# Patient Record
Sex: Female | Born: 1970 | Race: White | Marital: Married | Smoking: Never smoker
Health system: Southern US, Community
[De-identification: ages and names within clinical notes are randomized; demographics above are authoritative.]

## PROBLEM LIST (undated history)

## (undated) DIAGNOSIS — F319 Bipolar disorder, unspecified: Secondary | ICD-10-CM

## (undated) DIAGNOSIS — D689 Coagulation defect, unspecified: Secondary | ICD-10-CM

## (undated) DIAGNOSIS — F988 Other specified behavioral and emotional disorders with onset usually occurring in childhood and adolescence: Secondary | ICD-10-CM

## (undated) HISTORY — DX: Bipolar disorder, unspecified: F31.9

## (undated) HISTORY — DX: Coagulation defect, unspecified: D68.9

## (undated) HISTORY — PX: APPENDECTOMY: SHX54

## (undated) HISTORY — DX: Other specified behavioral and emotional disorders with onset usually occurring in childhood and adolescence: F98.8

## (undated) HISTORY — PX: CHOLECYSTECTOMY: SHX55

---

## 2000-05-19 ENCOUNTER — Emergency Department (HOSPITAL_COMMUNITY): Admission: EM | Admit: 2000-05-19 | Discharge: 2000-05-19 | Payer: Self-pay | Admitting: Emergency Medicine

## 2000-07-11 ENCOUNTER — Other Ambulatory Visit: Admission: RE | Admit: 2000-07-11 | Discharge: 2000-07-11 | Payer: Self-pay | Admitting: Internal Medicine

## 2000-07-22 ENCOUNTER — Encounter: Payer: Self-pay | Admitting: Gastroenterology

## 2000-07-22 ENCOUNTER — Ambulatory Visit (HOSPITAL_COMMUNITY): Admission: RE | Admit: 2000-07-22 | Discharge: 2000-07-22 | Payer: Self-pay | Admitting: Gastroenterology

## 2000-07-26 ENCOUNTER — Emergency Department (HOSPITAL_COMMUNITY): Admission: EM | Admit: 2000-07-26 | Discharge: 2000-07-26 | Payer: Self-pay | Admitting: Emergency Medicine

## 2000-08-03 ENCOUNTER — Encounter: Admission: RE | Admit: 2000-08-03 | Discharge: 2000-08-03 | Payer: Self-pay | Admitting: Gastroenterology

## 2000-08-03 ENCOUNTER — Encounter: Payer: Self-pay | Admitting: Gastroenterology

## 2015-03-26 ENCOUNTER — Telehealth: Payer: Self-pay | Admitting: Hematology and Oncology

## 2015-03-26 ENCOUNTER — Encounter: Payer: Self-pay | Admitting: Hematology and Oncology

## 2015-03-26 NOTE — Telephone Encounter (Signed)
Schedule appt from ref per MD preferred request. Faxed Pt Ref Letter to Provider and sent New Pt Packet to Patient. Set Reminder 5 Days prior to appt

## 2015-03-26 NOTE — Telephone Encounter (Signed)
Pt requested a change in schedule due to work conflicts

## 2015-04-14 ENCOUNTER — Ambulatory Visit: Payer: Self-pay | Admitting: Hematology and Oncology

## 2015-04-15 ENCOUNTER — Encounter: Payer: Self-pay | Admitting: Hematology and Oncology

## 2015-04-15 ENCOUNTER — Ambulatory Visit (HOSPITAL_BASED_OUTPATIENT_CLINIC_OR_DEPARTMENT_OTHER): Payer: 59 | Admitting: Hematology and Oncology

## 2015-04-15 VITALS — BP 132/78 | HR 83 | Temp 98.2°F | Resp 19 | Ht 63.0 in | Wt 264.6 lb

## 2015-04-15 DIAGNOSIS — Z7982 Long term (current) use of aspirin: Secondary | ICD-10-CM | POA: Diagnosis not present

## 2015-04-15 DIAGNOSIS — Z86718 Personal history of other venous thrombosis and embolism: Secondary | ICD-10-CM

## 2015-04-15 DIAGNOSIS — D6851 Activated protein C resistance: Secondary | ICD-10-CM | POA: Diagnosis not present

## 2015-04-15 HISTORY — DX: Personal history of other venous thrombosis and embolism: Z86.718

## 2015-04-15 NOTE — Progress Notes (Signed)
[facility] CONSULT NOTE  Patient Care Team: London Pepper, MD as PCP - General (Family Medicine)  CHIEF COMPLAINTS/PURPOSE OF CONSULTATION:  Remote history of DVT, for further management  HISTORY OF PRESENTING ILLNESS:  Dana Rodgers 45 y.o. female is here because of remote history of DVT. Unfortunately, I do not have any outside records. The patient is here today accompanied by her sister. She was able to give adequate history. The patient has relocated from [place] to be closer to family members. According to the patient, she had severe left upper arm pain approximately 4 years ago and presented to the emergency room and was evaluated. She was subsequently found to have blood clot in the left upper extremity. Ultrasound venous Doppler also showed concurrent evidence of blood clot in the left lower extremity although she was not symptomatic. According to the patient, a thrombophilia panel revealed that she has factor V Leiden mutation. She was hospitalized for 5-6 days and was transitioned to warfarin. The physician has difficulties getting her INR level under controlled. She had one episode of severe gum bleeding and was subsequently switched to fondaparinux. In addition to fondaparinux she was placed on chronic supplemental folic acid and vitamin B 12. She ran out of her prescription a month ago and has been off anticoagulation therapy. Since the diagnosis of blood clot, she has chronic left shoulder pain, left arm pain and left leg pain. Her physician has ordered frequent imaging study every 6 months while she is on anticoagulation therapy along with blood work. Prior to the diagnosis of blood clots, she denies any history of trauma,  long distance travel, dehydration, recent surgery, smoking or prolonged immobilization. The patient had placement of Mirena IUD at that time. She denies recent chest pain on exertion, shortness of breath on minimal exertion, pre-syncopal  episodes, hemoptysis, or palpitation. She had no prior history or diagnosis of cancer. Her age appropriate screening programs are up-to-date. She had prior surgeries before and never had perioperative thromboembolic events. The patient had been exposed to birth control pills and never had thrombotic events. The patient had been pregnant before and denies history of peripartum thromboembolic event or history of recurrent miscarriages. She has one stillborn. There is family history of blood clots in her sister but it was part of a postoperative complication. One grandfather also had history of blood clot. The patient denies any recent signs or symptoms of bleeding such as spontaneous epistaxis, hematuria or hematochezia. She complained of easy bruising.  MEDICAL HISTORY:  Past Medical History  Diagnosis Date  . Clotting disorder ([facility])   . Bipolar disorder ([facility])   . ADD (attention deficit disorder)     SURGICAL HISTORY: Past Surgical History  Procedure Laterality Date  . Cholecystectomy    . Appendectomy      SOCIAL HISTORY: Social History   Social History  . Marital Status: Married    Spouse Name: N/A  . Number of Children: N/A  . Years of Education: N/A   Occupational History  . Not on file.   Social History Main Topics  . Smoking status: Never Smoker   . Smokeless tobacco: Never Used  . Alcohol Use: No  . Drug Use: No  . Sexual Activity: Not on file     Comment: married, not working   Other Topics Concern  . Not on file   Social History Narrative  . No narrative on file    FAMILY HISTORY: Family History  Problem Relation Age of  Onset  . Clotting disorder Sister     DVT post op  . Cancer Paternal Grandmother     breast ca  . Clotting disorder Paternal Grandfather     DVT    ALLERGIES:  is allergic to latex and phenobarbital.  MEDICATIONS:  Current Outpatient Prescriptions  Medication Sig Dispense Refill  . ALPRAZolam (XANAX) 1 MG tablet     .  amphetamine-dextroamphetamine (ADDERALL) 20 MG tablet     . Cyanocobalamin 1000 MCG/ML KIT Inject 1,000 mcg as directed once a week.    . lamoTRIgine (LAMICTAL) 200 MG tablet     . levothyroxine (SYNTHROID, LEVOTHROID) 88 MCG tablet     . LORazepam (ATIVAN) 1 MG tablet     . SAPHRIS 5 MG SUBL 24 hr tablet     . VENTOLIN HFA 108 (90 Base) MCG/ACT inhaler     . zolpidem (AMBIEN) 10 MG tablet     . folic acid (FOLVITE) 578 MCG tablet Take 400 mcg by mouth daily.     No current facility-administered medications for this visit.    REVIEW OF SYSTEMS:   Constitutional: Denies fevers, chills or abnormal night sweats Eyes: Denies blurriness of vision, double vision or watery eyes Ears, nose, mouth, throat, and face: Denies mucositis or sore throat Respiratory: Denies cough, dyspnea or wheezes Cardiovascular: Denies palpitation, chest discomfort or lower extremity swelling Gastrointestinal:  Denies nausea, heartburn or change in bowel habits Skin: Denies abnormal skin rashes Lymphatics: Denies new lymphadenopathy  Neurological:Denies numbness, tingling or new weaknesses Behavioral/Psych: Mood is stable, no new changes  All other systems were reviewed with the patient and are negative.  PHYSICAL EXAMINATION: ECOG PERFORMANCE STATUS: 0 - Asymptomatic  Filed Vitals:   04/15/15 1359  BP: 132/78  Pulse: 83  Temp: 98.2 F (36.8 C)  Resp: 19   Filed Weights   04/15/15 1359  Weight: 264 lb 9.6 oz (120.022 kg)    GENERAL:alert, no distress and comfortable. She is morbidly obese SKIN: Noted skin discoloration over her abdominal wall. She has a few skin bruises EYES: normal, conjunctiva are pink and non-injected, sclera clear OROPHARYNX:no exudate, no erythema and lips, buccal mucosa, and tongue normal  NECK: supple, thyroid normal size, non-tender, without nodularity LYMPH:  no palpable lymphadenopathy in the cervical, axillary or inguinal LUNGS: clear to auscultation and percussion with  normal breathing effort HEART: regular rate & rhythm and no murmurs and no lower extremity edema ABDOMEN:abdomen soft, non-tender and normal bowel sounds Musculoskeletal:no cyanosis of digits and no clubbing  PSYCH: alert & oriented x 3 with fluent speech NEURO: no focal motor/sensory deficits  ASSESSMENT & PLAN:  I reviewed with the patient about the plan for care for provoked DVT  This last episode of blood clot appeared to be provoked, related to hormonal supplement from birth control/IUD. Thrombophilia panel was ordered and she tested positive for factor V Leiden mutation. Duration of treatment is typically only 3-6 months. It is not clear to me why she was maintained on chronic anticoagulation therapy. Just because she tested positive factor V Leiden mutation doesn't mean she needs to be on lifelong anticoagulation therapy. I'm also surprised that she has a new Mirena IUD placed not long after her diagnosis of blood clot. I recommend consideration for a different kind of birth control such as a copper IUD. It is also conceivable that the patient may be menopausal and birth control may no longer needed but I would defer to her gynecologist for further management.  There is also no indication to order routine imaging study to assess her blood vessels. There is also no indication for her to remain on chronic folic acid and vitamin B-12 supplement to prevent risk of blood clots or "improve flow of blood".  I am not comfortable prescribing further anticoagulation therapy for her as I do not feel that is absolutely necessary for her to remain on anticoagulation therapy moving forward.  I discussed with her evidence based medicine using aspirin therapy based on publication in [employer]:  Aspirin for Preventing the Recurrence of Venous Thromboembolism Roderick Pee, M.D., Ph.D., Vangie Bicker, M.D., Mickie Kay, M.D., Melbourne Abts, M.D., Mannie Stabile,  M.D., Gillis Ends, M.D., Sharyon Medicus, M.D., Carrolyn Leigh, M.D., Tod Persia, M.D., Barnie Mort, M.D., Abelardo Diesel, M.D., and Arlana Lindau, M.D., Ph.D., for the [facility] Investigators* [employer] 2012; 250:[id number] 79, 2012DOI: [id number]  Aspirin reduced the risk of recurrence when given to patients with unprovoked venous thromboembolism who had discontinued anticoagulant treatment, with no apparent increase in the risk of major bleeding. 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 Aspirin may help with that.  Another main issue we discussed today included the role of screening other family members for thrombophilia disorder. At present time, I would not recommend testing the patient's family members as it would not benefit them.  Thrombophilia disorder is a genetic predisposition which increases an individual's risk for a thrombotic event, NOT a disease.  We discussed the implications of genetic screening including the possibility of uninsurability, costs involved, emotional distress and possible discrimination at various levels for the affected individual.  Rather than genetic screening, one can possibly benefit from genetic counseling or dissemination of appropriate reading materials to educate other family members.  Finally, at the end of our consultation today, I reinforced the importance of preventive strategies such as avoiding hormonal supplement, avoiding cigarette smoking, keeping up-to-date with screening programs for early cancer detection, frequent ambulation for long distance travel and aggressive DVT prophylaxis in all surgical settings.  I have not made a return appointment for the patient to come back. The patient stated that she will try to fax me all the information from her prior hematologist. I will call her once I have received those information and will discuss with her if any further recommendation is needed after  reviewing her outside records   All questions were answered. The patient knows to call the clinic with any problems, questions or concerns. I spent 55 minutes counseling the patient face to face. The total time spent in the appointment was 60 minutes and more than 50% was on counseling.     [facility], [place], MD 04/15/2015 3:55 PM

## 2017-11-28 ENCOUNTER — Other Ambulatory Visit: Payer: Self-pay | Admitting: Psychiatry

## 2017-11-29 ENCOUNTER — Other Ambulatory Visit: Payer: Self-pay | Admitting: Psychiatry

## 2017-12-28 ENCOUNTER — Ambulatory Visit
Admission: RE | Admit: 2017-12-28 | Discharge: 2017-12-28 | Disposition: A | Discharge status: Home / Self Care | Payer: BLUE CROSS/BLUE SHIELD | Source: Ambulatory Visit | Attending: Physician Assistant | Admitting: Physician Assistant

## 2017-12-28 ENCOUNTER — Other Ambulatory Visit: Payer: Self-pay | Admitting: Physician Assistant

## 2017-12-28 DIAGNOSIS — R2232 Localized swelling, mass and lump, left upper limb: Secondary | ICD-10-CM | POA: Insufficient documentation

## 2017-12-28 DIAGNOSIS — Z86718 Personal history of other venous thrombosis and embolism: Secondary | ICD-10-CM

## 2018-01-04 ENCOUNTER — Encounter: Payer: Self-pay | Admitting: Emergency Medicine

## 2018-01-04 DIAGNOSIS — F22 Delusional disorders: Secondary | ICD-10-CM

## 2018-01-04 DIAGNOSIS — F902 Attention-deficit hyperactivity disorder, combined type: Secondary | ICD-10-CM

## 2018-01-04 DIAGNOSIS — F4312 Post-traumatic stress disorder, chronic: Secondary | ICD-10-CM

## 2018-01-04 DIAGNOSIS — F3175 Bipolar disorder, in partial remission, most recent episode depressed: Secondary | ICD-10-CM

## 2018-01-04 DIAGNOSIS — F909 Attention-deficit hyperactivity disorder, unspecified type: Secondary | ICD-10-CM | POA: Insufficient documentation

## 2018-01-21 ENCOUNTER — Other Ambulatory Visit: Payer: Self-pay | Admitting: Psychiatry

## 2018-01-23 ENCOUNTER — Ambulatory Visit: Payer: BLUE CROSS/BLUE SHIELD | Admitting: Psychiatry

## 2018-01-23 ENCOUNTER — Encounter: Payer: Self-pay | Admitting: Psychiatry

## 2018-01-23 VITALS — BP 156/96 | HR 89 | Ht 63.0 in | Wt 276.0 lb

## 2018-01-23 DIAGNOSIS — F22 Delusional disorders: Secondary | ICD-10-CM

## 2018-01-23 DIAGNOSIS — F902 Attention-deficit hyperactivity disorder, combined type: Secondary | ICD-10-CM | POA: Diagnosis not present

## 2018-01-23 DIAGNOSIS — F4312 Post-traumatic stress disorder, chronic: Secondary | ICD-10-CM | POA: Diagnosis not present

## 2018-01-23 DIAGNOSIS — F3175 Bipolar disorder, in partial remission, most recent episode depressed: Secondary | ICD-10-CM

## 2018-01-23 MED ORDER — BUPROPION HCL ER (XL) 150 MG PO TB24: 150.0000 mg | ORAL_TABLET | Freq: Every day | ORAL | 2 refills | Status: DC

## 2018-01-23 MED ORDER — AMPHETAMINE-DEXTROAMPHETAMINE 20 MG PO TABS: ORAL_TABLET | ORAL | 0 refills | Status: DC

## 2018-01-23 MED ORDER — LAMOTRIGINE 200 MG PO TABS: 200.0000 mg | ORAL_TABLET | Freq: Every day | ORAL | 2 refills | Status: DC

## 2018-01-23 MED ORDER — ALPRAZOLAM 1 MG PO TABS: 1.0000 mg | ORAL_TABLET | Freq: Two times a day (BID) | ORAL | 2 refills | Status: DC | PRN

## 2018-01-23 MED ORDER — ZOLPIDEM TARTRATE 10 MG PO TABS: 10.0000 mg | ORAL_TABLET | Freq: Every day | ORAL | 2 refills | Status: DC

## 2018-01-23 MED ORDER — SAPHRIS 5 MG SL SUBL: 5.0000 mg | SUBLINGUAL_TABLET | Freq: Two times a day (BID) | SUBLINGUAL | 2 refills | Status: DC

## 2018-01-23 NOTE — Progress Notes (Signed)
[facility]  Patient ID: Dana Rodgers,  MRN: [id number]  PCP: London Pepper, MD  Date of Evaluation: 01/23/2018 Time spent:20 minutes  Chief Complaint:  Chief Complaint    Follow-up; ADHD; Anxiety; Depression; Manic Behavior; Agitation      HISTORY/CURRENT STATUS: Dana Rodgers is seen individually face-to-face with consent not collateral for psychiatric interview and exam in 79-monthevaluation and management of bipolar, PTSD, ADHD, and somatic delusional disorder.  She is stressed by the office medical assistant who is new determining her vitals today and blood pressure was elevated in the process.  She is stressed by her pharmacy having consolidated with another having twice the volume stating they have sent for refills here in been told that patient is not registered to this office anymore or refills being delayed or denied.  The patient had as of last visit preferred to have the pharmacy notify [place] were needed except for the Adderall being written prescriptions.  Hopefully now with electronic record and prescribing, this process can start fresh and be more secure for the patient though the pharmacy is already requesting Xanax refill through the office nurse today as the patient is here for an appointment receiving refills.  She spends less time with sister and sister's daughter now that they reside on the property than she did when they lived across town.  Husband has been home for the holiday and again briefly this week and parents did not visit so patient is not stressed out this holiday.  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  She continues her Synthroid otherwise from another doctor but all other medications are through this office.  She states she uses less Xanax but required a refill #60 on November 4 and December [facility]  She has had 2 weeks of depression in bed in December now better, and her delusions and moods are better though stressed by interim changes.  Depression         This is a recurrent problem.  The current episode started more than 1 year ago.   The onset quality is sudden.   The problem occurs intermittently.  The problem has been gradually improving since onset.  Associated symptoms include decreased concentration, fatigue, decreased interest and sad.  Associated symptoms include no suicidal ideas.     The symptoms are aggravated by medication, social issues and family issues.  Past treatments include other medications.  Compliance with treatment is variable.  Past compliance problems include difficulty with treatment plan, medication issues, medical issues and difficulty understanding directions.  Previous treatment provided moderate relief.  Risk factors include family history of mental illness, family history, a change in medication usage/dosage, stress, prior traumatic experience and major life event.   Past medical history includes hypothyroidism, recent illness, bipolar disorder, mental health disorder and post-traumatic stress disorder.     Pertinent negatives include no physical disability, no obsessive-compulsive disorder, no schizophrenia and no head trauma.   Individual Medical History/ Review of Systems: Changes? 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  Allergies: Latex and Phenobarbital  Current Medications:  Current Outpatient Medications:  .  Cyanocobalamin 1000 MCG/ML KIT, Inject 1,000 mcg as directed once a week., Disp: , Rfl:  .  levothyroxine (SYNTHROID, LEVOTHROID) 88 MCG tablet, ,  Disp: , Rfl:  .  VENTOLIN HFA 108 (90 Base) MCG/ACT inhaler, , Disp: , Rfl:  .  ALPRAZolam (XANAX) 1 MG tablet, Take 1 tablet (1 mg total) by mouth 2 (two) times daily as needed., Disp: 60 tablet, Rfl:  2 .  amphetamine-dextroamphetamine (ADDERALL) 20 MG tablet, As 1.5 tablet every morning and 1 tablet every noon and 1600, Disp: 105 tablet, Rfl: 0 .  [START ON 02/22/2018] amphetamine-dextroamphetamine (ADDERALL) 20 MG tablet, Take 1.5 tablets total 30 mg every morning and 1 tablet every noon and 1600, Disp: 105 tablet, Rfl: 0 .  [START ON 03/24/2018] amphetamine-dextroamphetamine (ADDERALL) 20 MG tablet, Take 1.5 tablets total 30 mg every morning and take 1 tablet every noon and 1600, Disp: 105 tablet, Rfl: 0 .  buPROPion (WELLBUTRIN XL) 150 MG 24 hr tablet, Take 1 tablet (150 mg total) by mouth daily., Disp: 30 tablet, Rfl: 2 .  folic acid (FOLVITE) 568 MCG tablet, Take 400 mcg by mouth daily., Disp: , Rfl:  .  lamoTRIgine (LAMICTAL) 200 MG tablet, Take 1 tablet (200 mg total) by mouth at bedtime., Disp: 30 tablet, Rfl: 2 .  SAPHRIS 5 MG SUBL 24 hr tablet, Place 1 tablet (5 mg total) under the tongue 2 (two) times daily., Disp: 60 tablet, Rfl: 2 .  zolpidem (AMBIEN) 10 MG tablet, Take 1 tablet (10 mg total) by mouth at bedtime., Disp: 30 tablet, Rfl: 2 Medication Side Effects: none  Family Medical/ Social History: Changes? No  MENTAL HEALTH EXAM: Muscle strengths 5/5, postural reflexes 0/0, a IMS equals 0, and weight is up 14 pounds in 4 months likely as the etiology for elevated blood pressure today Blood pressure (!) 156/96, pulse 89, height _0  (1.6 m), weight 276 lb (125.2 kg).Body mass index is 48.89 kg/m.   General Appearance: Casual, Fairly Groomed, Guarded and Obese  Eye Contact:  Fair  Speech:  Blocked and Slow  Volume:  Normal  Mood:  Anxious, Dysphoric, Hopeless and Worthless  Affect:  Inappropriate, Restricted and Anxious  Thought Process:  Disorganized and Linear  Orientation:  Full (Time, Place, and Person)  Thought Content: Obsessions, Paranoid Ideation and Rumination   Suicidal Thoughts:  No  Homicidal Thoughts:  No  Memory:  Immediate;   Fair Remote;   Good  Judgement:   Fair  Insight:  Fair  Psychomotor Activity:  Increased and Decreased  Concentration:  Concentration: Fair and Attention Span: Fair  Recall:  Good  Fund of Knowledge: Fair  Language: Fair  Assets:  Leisure Time Resilience Social Support  ADL's:  Intact  Cognition: WNL  Prognosis:  Fair    DIAGNOSES:    ICD-10-CM   1. Bipolar I disorder, current or most recent episode depressed, in partial remission with mixed features (HCC) F31.75 lamoTRIgine (LAMICTAL) 200 MG tablet    SAPHRIS 5 MG SUBL 24 hr tablet    buPROPion (WELLBUTRIN XL) 150 MG 24 hr tablet    zolpidem (AMBIEN) 10 MG tablet  2. Chronic post-traumatic stress disorder F43.12 zolpidem (AMBIEN) 10 MG tablet    ALPRAZolam (XANAX) 1 MG tablet  3. Attention deficit hyperactivity disorder, combined type F90.2 amphetamine-dextroamphetamine (ADDERALL) 20 MG tablet    amphetamine-dextroamphetamine (ADDERALL) 20 MG tablet    amphetamine-dextroamphetamine (ADDERALL) 20 MG tablet    buPROPion (WELLBUTRIN XL) 150 MG 24 hr tablet  4. 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  However she expands and elaborates and is suspicious but not delusional about pharmacy and medical office perturbations that become sources of doubt rather than security with change.  For that reason all medications [facility] E scribing most Adderall 20 mg IR tablet taking 1-1/2 every morning and 1 every noon and 1600 as supply of 105 tablets each for January, February, and March for ADHD sent to [facility].  Saphris 5 mg sublingual twice daily and Lamictal 200 mg nightly are sent as a month supply each and 3 refills for bipolar, delusional disorder, and PTSD.  Wellbutrin 150 mg XL every morning is sent as a month supply  and 3 refills for bipolar and ADHD.  Xanax 1 mg twice daily as needed for anxiety of PTSD and Ambien 10 mg nightly for insomnia of PTSD are sent as a month supply and 3 refills, to return in 3 months.   Delight Hoh, MD

## 2018-01-23 NOTE — Telephone Encounter (Signed)
Pharmacy request received by office nursing from last weekend at the same time patient arrives here today for scheduled appointment stating that her pharmacy has changed significantly and that this office has declined she is a patient here once or twice while at other times being delayed in getting any refills requested.  We attempt to start over, and the alprazolam is sent by me rather than nurse to [facility].  She has no contraindication and Xanax is medically necessary.

## 2018-03-07 ENCOUNTER — Telehealth: Payer: Self-pay

## 2018-03-07 NOTE — Telephone Encounter (Signed)
Prior authorization received from [employer] for Amphetamine-dextroamphetamine 20mg  tablets #105 effective 02/27/2018-02/28/2019

## 2018-04-24 ENCOUNTER — Encounter: Payer: Self-pay | Admitting: Psychiatry

## 2018-04-24 ENCOUNTER — Ambulatory Visit (INDEPENDENT_AMBULATORY_CARE_PROVIDER_SITE_OTHER): Payer: BLUE CROSS/BLUE SHIELD | Admitting: Psychiatry

## 2018-04-24 DIAGNOSIS — F4312 Post-traumatic stress disorder, chronic: Secondary | ICD-10-CM

## 2018-04-24 DIAGNOSIS — F902 Attention-deficit hyperactivity disorder, combined type: Secondary | ICD-10-CM | POA: Diagnosis not present

## 2018-04-24 DIAGNOSIS — F22 Delusional disorders: Secondary | ICD-10-CM

## 2018-04-24 DIAGNOSIS — F3175 Bipolar disorder, in partial remission, most recent episode depressed: Secondary | ICD-10-CM

## 2018-04-24 MED ORDER — AMPHETAMINE-DEXTROAMPHETAMINE 20 MG PO TABS: ORAL_TABLET | ORAL | 0 refills | Status: DC

## 2018-04-24 MED ORDER — ZOLPIDEM TARTRATE 10 MG PO TABS: 10.0000 mg | ORAL_TABLET | Freq: Every day | ORAL | 2 refills | Status: DC

## 2018-04-24 MED ORDER — ALPRAZOLAM 1 MG PO TABS: 1.0000 mg | ORAL_TABLET | Freq: Two times a day (BID) | ORAL | 2 refills | Status: DC | PRN

## 2018-04-24 MED ORDER — ASENAPINE MALEATE 5 MG SL SUBL: 5.0000 mg | SUBLINGUAL_TABLET | Freq: Two times a day (BID) | SUBLINGUAL | 2 refills | Status: DC

## 2018-04-24 MED ORDER — BUPROPION HCL ER (XL) 150 MG PO TB24: 150.0000 mg | ORAL_TABLET | Freq: Every day | ORAL | 2 refills | Status: DC

## 2018-04-24 MED ORDER — LAMOTRIGINE 200 MG PO TABS: 200.0000 mg | ORAL_TABLET | Freq: Every day | ORAL | 2 refills | Status: DC

## 2018-04-24 NOTE — Progress Notes (Signed)
[facility]  Patient ID: [name],  MRN: [id number]  PCP: London Pepper, MD  Date of Evaluation: 04/24/2018 Time spent: 15 minutes from 1150 to 1205  I connected with patient by a video enabled telemedicine application or telephone, with their informed consent, and verified patient privacy and that I am speaking with the correct person using two identifiers.  I was located at [place] office and patient at home residence.  Chief Complaint:  Chief Complaint    Depression; Anxiety; ADHD; Paranoid      HISTORY/CURRENT STATUS: Dana Rodgers is provided telemedicine audio appointment session, as she declines video due to posttraumatic stress and history of somatic delusions, with consent not collateral for psychiatric interview and exam in 29-monthevaluation and management of bipolar, PTSD, ADHD and delusional disorder.  Patient often sequesters herself in her home away from family and activities, but she is now stressed by being confined to home needing to at least go out for a drive.  She thereby has old and new stressors which however require some integration on her part of primary pathology.  Overall she is coping reasonably well with the national emergency coronavirus pandemic.  She hesitates to change any medications especially her psychiatric medications and continues Adderall as before taking daily needing monthly fill per [place] registry while Xanax and Ambien are generally filled every other month.  Now with 3 1/2 years of structured care here, she sees her therapist no longer and has few psychiatric decompensations, though other medical problems continue to occur.  She has no current psychosis, mania, suicidality, or dissociation today.  Still the patient appears to sequester details of past traumatic experiences.  Depression         The patient presents with no depression.  This is a recurrent problem.  The current episode started more than 1 year ago.   The onset quality is  sudden.   The problem occurs intermittently.  The problem has been gradually improving since onset.  Associated symptoms include decreased concentration, fatigue, body aches, myalgias and sad.  Associated symptoms include no helplessness, no hopelessness, does not have insomnia, not irritable, no restlessness, no decreased interest, no appetite change, no headaches, no indigestion and no suicidal ideas.     The symptoms are aggravated by social issues and family issues.  Past treatments include other medications, psychotherapy, SSRIs - Selective serotonin reuptake inhibitors and TCAs - Tricyclic antidepressants.  Compliance with treatment is variable and good.  Past compliance problems include medical issues, difficulty with treatment plan and medication issues.  Previous treatment provided moderate relief.  Risk factors include stress, a change in medications, family history, family history of mental illness, history of mental illness, major life event, prior traumatic experience and emotional abuse.   Past medical history includes anxiety, bipolar disorder, mental health disorder and post-traumatic stress disorder.     Pertinent negatives include no chronic illness, no recent illness, no physical disability, no eating disorder, no depression, no obsessive-compulsive disorder, no schizophrenia, no suicide attempts and no head trauma.   Individual Medical History/ Review of Systems: Changes? :Yes Being sick for the last 5 weeks with allergic rhinitis and sinusitis requiring other treatments.  Last orthopedic contact for impingement syndrome was in March with MRI of the neck just before last appointment here.  Allergies: Latex and Phenobarbital  Current Medications:  Current Outpatient Medications:  .  ALPRAZolam (XANAX) 1 MG tablet, Take 1 tablet (1 mg total) by mouth 2 (two) times daily as needed., Disp:  60 tablet, Rfl: 2 .  amphetamine-dextroamphetamine (ADDERALL) 20 MG tablet, As 1.5 tablet every  morning and 1 tablet every noon and 1600, Disp: 105 tablet, Rfl: 0 .  [START ON 05/24/2018] amphetamine-dextroamphetamine (ADDERALL) 20 MG tablet, Take 1.5 tablets total 30 mg every morning and 1 tablet every noon and 1600, Disp: 105 tablet, Rfl: 0 .  [START ON 06/23/2018] amphetamine-dextroamphetamine (ADDERALL) 20 MG tablet, Take 1.5 tablets total 30 mg every morning and take 1 tablet every noon and 1600, Disp: 105 tablet, Rfl: 0 .  asenapine (SAPHRIS) 5 MG SUBL 24 hr tablet, Place 1 tablet (5 mg total) under the tongue 2 (two) times daily., Disp: 60 tablet, Rfl: 2 .  buPROPion (WELLBUTRIN XL) 150 MG 24 hr tablet, Take 1 tablet (150 mg total) by mouth daily., Disp: 30 tablet, Rfl: 2 .  Cyanocobalamin 1000 MCG/ML KIT, Inject 1,000 mcg as directed once a week., Disp: , Rfl:  .  folic acid (FOLVITE) 030 MCG tablet, Take 400 mcg by mouth daily., Disp: , Rfl:  .  lamoTRIgine (LAMICTAL) 200 MG tablet, Take 1 tablet (200 mg total) by mouth at bedtime., Disp: 30 tablet, Rfl: 2 .  levothyroxine (SYNTHROID, LEVOTHROID) 88 MCG tablet, , Disp: , Rfl:  .  VENTOLIN HFA 108 (90 Base) MCG/ACT inhaler, , Disp: , Rfl:  .  zolpidem (AMBIEN) 10 MG tablet, Take 1 tablet (10 mg total) by mouth at bedtime., Disp: 30 tablet, Rfl: 2 Medication Side Effects: none  Family Medical/ Social History: Changes? Yes, husband working on hospital grounds with tent strutures for patient overflow with coronavirus pandemic.  She worries about husband having congestive heart failure thereby being vulnerable to illness.  MENTAL HEALTH EXAM:  There were no vitals taken for this visit.There is no height or weight on file to calculate BMI. as not present  General Appearance: N/A  Eye Contact:  N/A  Speech:  Blocked, Clear and Coherent and Normal Rate  Volume:  Normal  Mood:  Anxious, Depressed, Dysphoric and Worthless  Affect:  Depressed, Full Range and Anxious  Thought Process:  Goal Directed and Linear  Orientation:  Full (Time,  Place, and Person)  Thought Content: Ilusions, Obsessions, Paranoid Ideation and Rumination   Suicidal Thoughts:  No  Homicidal Thoughts:  No  Memory:  Immediate;   Good Remote;   Fair  Judgement:  Fair  Insight:  Fair  Psychomotor Activity:  Normal and Mannerisms  Concentration:  Concentration: Fair and Attention Span: Poor  Recall:  [employer] of Knowledge: Good  Language: Fair  Assets:  Desire for Improvement Social Support Talents/Skills  ADL's:  Intact  Cognition: WNL  Prognosis:  Fair    DIAGNOSES:    ICD-10-CM   1. Bipolar I disorder, current or most recent episode depressed, in partial remission with mixed features (HCC) F31.75 buPROPion (WELLBUTRIN XL) 150 MG 24 hr tablet    lamoTRIgine (LAMICTAL) 200 MG tablet    asenapine (SAPHRIS) 5 MG SUBL 24 hr tablet    zolpidem (AMBIEN) 10 MG tablet  2. Chronic post-traumatic stress disorder F43.12 ALPRAZolam (XANAX) 1 MG tablet    zolpidem (AMBIEN) 10 MG tablet  3. Attention deficit hyperactivity disorder, combined type F90.2 amphetamine-dextroamphetamine (ADDERALL) 20 MG tablet    amphetamine-dextroamphetamine (ADDERALL) 20 MG tablet    amphetamine-dextroamphetamine (ADDERALL) 20 MG tablet    buPROPion (WELLBUTRIN XL) 150 MG 24 hr tablet  4. 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  She has not resumed therapy finding that mobilization of triggers her symptoms is more driven consequence than providing opportunity for working through relief and new strength.  She is E scribed to continue Adderall 20 mg IR tablet taking 1-1/2 tablets total 30 mg every morning and 1 tablet 20 mg at noon and 1600 and 10 sent as #105 each for April, May, and June for ADHD to [place].  She continues Wellbutrin 150 mg  XL every morning for depression, anxiety, and ADHD as #30 [facility]  She continues Saphris 5 mg sublingual twice daily and Lamictal 200 mg at bedtime sent as a month supply and 2 refills each for bipolar disorder and delusional disorder [facility]  eScription's are renewed for Ambien 10 mg nightly and Xanax 1 mg up to twice daily if needed for insomnia and anxiety as a month supply each and 2 refills for PTSD and bipolar [facility].  She returns in 3 months.  Virtual Visit via Telephone Note  I connected with Georgianne Fick on 04/30/18 at 10:40 AM EDT by telephone and verified that I am speaking with the correct person using two identifiers.   I discussed the limitations, risks, security and privacy concerns of performing an evaluation and management service by telephone and the availability of in person appointments. I also discussed with the patient that there may be a patient responsible charge related to this service. The patient expressed understanding and agreed to proceed.   History of Present Illness: Evaluation and management of bipolar, PTSD, ADHD and delusional disorder often finds patient sequesters herself in her home away from family and activities, but she is now stressed by being confined to home needing to at least go out for a drive.  She thereby has old and new stressors which however require some integration on her part of primary pathology. She hesitates to change any medications especially her psychiatric medications and continues Adderall as before taking daily needing monthly fill per [place] registry while Xanax and Ambien are generally filled every other month with benefit from 3 1/2 years of structured care here,   Observations/Objective: Mood:  Anxious, Depressed, Dysphoric and Worthless  Affect:  Depressed, Full Range and Anxious  Thought Process:  Goal Directed and Linear  Orientation:  Full (Time, Place, and Person)  Thought Content: Ilusions,  Obsessions, Paranoid Ideation and Rumination     Assessment and Plan: Continue Adderall 20 mg IR tablet taking 1-1/2 tablets total 30 mg every morning and 1 tablet 20 mg at noon and 1600 and 10 sent as #105 each for April, May, and June for ADHD to [place].  She continues Wellbutrin 150 mg XL every morning for depression, anxiety, and ADHD, Saphris 5 mg sublingual twice daily and Lamictal 200 mg at bedtime for bipolar disorder and delusional disorder, and Ambien 10 mg nightly and Xanax 1 mg up to twice daily if needed for anxiety and insomnia for PTSD and [facility]  Follow Up Instructions: She returns in 3 months.   I discussed the assessment and treatment plan with the patient. The patient was provided an opportunity to ask questions and all were answered. The patient agreed with the plan and demonstrated an understanding of the instructions.   The patient was advised to call back or seek  an in-person evaluation if the symptoms worsen or if the condition fails to improve as anticipated.  I provided 15 minutes of non-face-to-face time during this encounter.   Delight Hoh, MD  Delight Hoh, MD

## 2018-07-03 ENCOUNTER — Telehealth: Payer: Self-pay | Admitting: Psychiatry

## 2018-07-03 NOTE — Telephone Encounter (Signed)
He already has a rx on file for Adderall at his pharmacy

## 2018-07-03 NOTE — Telephone Encounter (Signed)
Pt needs refill [facility] [place]

## 2018-07-07 ENCOUNTER — Telehealth: Payer: Self-pay | Admitting: Psychiatry

## 2018-07-07 ENCOUNTER — Other Ambulatory Visit: Payer: Self-pay

## 2018-07-07 DIAGNOSIS — F902 Attention-deficit hyperactivity disorder, combined type: Secondary | ICD-10-CM

## 2018-07-07 MED ORDER — AMPHETAMINE-DEXTROAMPHETAMINE 20 MG PO TABS: ORAL_TABLET | ORAL | 0 refills | Status: DC

## 2018-07-07 NOTE — Telephone Encounter (Signed)
I will call pharmacy should have 1 on file already

## 2018-07-07 NOTE — Telephone Encounter (Signed)
[facility] [address] receipt of 04/24/2018 Adderall eScription for 06/23/2018 fill which Epic documents their receipt.  [place] registry documents second fill of 04/24/2018 eScription's on 05/29/2018 with no third filled.  We send another fill medically necessary for patient with no other contraindication rale 20 mg IR tablet #105 in 3 divided doses.

## 2018-07-07 NOTE — Telephone Encounter (Signed)
Pt requesting a refill on her Adderall. Please send to [facility].

## 2018-07-24 ENCOUNTER — Ambulatory Visit: Payer: BLUE CROSS/BLUE SHIELD | Admitting: Psychiatry

## 2018-08-02 ENCOUNTER — Other Ambulatory Visit: Payer: Self-pay | Admitting: Psychiatry

## 2018-08-02 DIAGNOSIS — F3175 Bipolar disorder, in partial remission, most recent episode depressed: Secondary | ICD-10-CM

## 2018-08-02 DIAGNOSIS — F902 Attention-deficit hyperactivity disorder, combined type: Secondary | ICD-10-CM

## 2018-08-02 DIAGNOSIS — F4312 Post-traumatic stress disorder, chronic: Secondary | ICD-10-CM

## 2018-08-03 NOTE — Telephone Encounter (Signed)
Last appointment/06/2018 not showing for appointment last week refills medically necessary and sent for Xanax, Ambien, Lamictal, and Wellbutrin

## 2018-08-03 NOTE — Telephone Encounter (Signed)
Due for follow up this month but nothing scheduled yet

## 2018-08-30 ENCOUNTER — Other Ambulatory Visit: Payer: Self-pay

## 2018-08-30 ENCOUNTER — Encounter: Payer: Self-pay | Admitting: Psychiatry

## 2018-08-30 ENCOUNTER — Ambulatory Visit (INDEPENDENT_AMBULATORY_CARE_PROVIDER_SITE_OTHER): Payer: BC Managed Care – PPO | Admitting: Psychiatry

## 2018-08-30 VITALS — Ht 63.0 in | Wt 282.0 lb

## 2018-08-30 DIAGNOSIS — F4312 Post-traumatic stress disorder, chronic: Secondary | ICD-10-CM

## 2018-08-30 DIAGNOSIS — F22 Delusional disorders: Secondary | ICD-10-CM

## 2018-08-30 DIAGNOSIS — F3175 Bipolar disorder, in partial remission, most recent episode depressed: Secondary | ICD-10-CM

## 2018-08-30 DIAGNOSIS — F902 Attention-deficit hyperactivity disorder, combined type: Secondary | ICD-10-CM

## 2018-08-30 MED ORDER — AMPHETAMINE-DEXTROAMPHETAMINE 20 MG PO TABS: ORAL_TABLET | ORAL | 0 refills | Status: DC

## 2018-08-30 MED ORDER — LAMOTRIGINE 200 MG PO TABS: 200.0000 mg | ORAL_TABLET | Freq: Every day | ORAL | 2 refills | Status: DC

## 2018-08-30 MED ORDER — SAPHRIS 5 MG SL SUBL: 5.0000 mg | SUBLINGUAL_TABLET | Freq: Two times a day (BID) | SUBLINGUAL | 2 refills | Status: DC

## 2018-08-30 MED ORDER — ALPRAZOLAM 1 MG PO TABS: ORAL_TABLET | ORAL | 2 refills | Status: DC

## 2018-08-30 MED ORDER — ZOLPIDEM TARTRATE 10 MG PO TABS: 10.0000 mg | ORAL_TABLET | Freq: Every day | ORAL | 2 refills | Status: DC

## 2018-08-30 MED ORDER — BUPROPION HCL ER (XL) 150 MG PO TB24: 150.0000 mg | ORAL_TABLET | Freq: Every day | ORAL | 2 refills | Status: DC

## 2018-08-30 NOTE — Progress Notes (Signed)
[facility]  Patient ID: Dana Rodgers,  MRN: [id number]  PCP: London Pepper, MD  Date of Evaluation: 08/30/2018 Time spent:20 minutes from 1020 to 1040  Chief Complaint:  Chief Complaint    Depression; Manic Behavior; Anxiety; Trauma; ADHD; Paranoid      HISTORY/CURRENT STATUS: Dana Rodgers is seen onsite in office face-to-face individually with consent with epic collateral for psychiatric interview and exam in 41-monthevaluation and management of bipolar, PTSD, ADHD, and delusional disorder somatic type.  Patient has mixed feelings about her current homestead property no longer feeling as far away from the rest of the world as in the past.  She now has delivery by several retail systems including [employer] though she never enters [employer]  She reviews siding with the liberal faction regarding coronavirus as to her debates with others and her own lifestyle but without recent decompensation into delusions or posttraumatic flashbacks.  She in the interim has had a part-time job at [employer] is now on leave until November for her neck surgery.  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  She integrates quite well in her discussion of stressors in the family, the environment, and her medical health.  [place] registry documents last Ambien and Xanax filled on 08/03/2018 and last Adderall 07/05/2018, as she discusses using much less Adderall that she has prepared for surgery risking emotional stress exacerbation of all of her pathology with mainly depression occurring.  She has no current delusion, dissociation, delirium, or mania.   Depression        The patient presents with mild depression as a recurrent problem.  The current episode started more than 1 month ago.   The onset quality is sudden.   The  problem occurs intermittently.  The problem has been gradually improving since onset.  Associated symptoms include decreased interest, decreased concentration, fatigue, body aches, and sad.  Associated symptoms include no helplessness, no hopelessness, does not have insomnia, not irritable, no restlessness, no appetite change, no headaches, no indigestion and no suicidal ideas.     The symptoms are aggravated by social issues and family issues.  Past treatments include other medications, psychotherapy, SSRIs - Selective serotonin reuptake inhibitors and TCAs - Tricyclic antidepressants.  Compliance with treatment is variable and good.  Past compliance problems include medical issues, difficulty with treatment plan and medication issues.  Previous treatment provided moderate relief.  Risk factors include stress, a change in medications, family history, family history of mental illness, history of mental illness, major life event, prior traumatic experience and emotional abuse.   Past medical history includes anxiety, bipolar disorder, mental health disorder and post-traumatic stress disorder.     Pertinent negatives include no chronic illness, no recent illness, no physical disability, no eating disorder, no depression, no obsessive-compulsive disorder, no schizophrenia, no suicide attempts and no head trauma.  Individual Medical History/ Review of Systems: Changes? :Yes  anterior C6-7 disc replacement reconstruction 08/04/2018 requiring preop COVID which was negative relieving likely 70% of her hip esthesia thus far of her left upper extremity radiculopathy.  Allergies: Latex and Phenobarbital  Current Medications:  Current Outpatient Medications:  .  ALPRAZolam (XANAX) 1 MG tablet, TAKE 1 TABLET (1 MG TOTAL) BY MOUTH 2 (TWO) TIMES DAILY AS NEEDED. *, Disp: 60 tablet, Rfl: 0 .  amphetamine-dextroamphetamine (ADDERALL) 20 MG  tablet, Take 1.5 tablets total 30 mg every morning and 1 tablet every noon and 1600,  Disp: 105 tablet, Rfl: 0 .  amphetamine-dextroamphetamine (ADDERALL) 20 MG tablet, Take 1.5 tablets total 30 mg every morning and take 1 tablet every noon and 1600, Disp: 105 tablet, Rfl: 0 .  amphetamine-dextroamphetamine (ADDERALL) 20 MG tablet, As 1.5 tablet every morning and 1 tablet every noon and 1600, Disp: 105 tablet, Rfl: 0 .  asenapine (SAPHRIS) 5 MG SUBL 24 hr tablet, Place 1 tablet (5 mg total) under the tongue 2 (two) times daily., Disp: 60 tablet, Rfl: 2 .  buPROPion (WELLBUTRIN XL) 150 MG 24 hr tablet, TAKE 1 TABLET BY MOUTH EVERY DAY, Disp: 30 tablet, Rfl: 0 .  Cyanocobalamin 1000 MCG/ML KIT, Inject 1,000 mcg as directed once a week., Disp: , Rfl:  .  folic acid (FOLVITE) 884 MCG tablet, Take 400 mcg by mouth daily., Disp: , Rfl:  .  lamoTRIgine (LAMICTAL) 200 MG tablet, TAKE 1 TABLET (200 MG TOTAL) BY MOUTH AT BEDTIME., Disp: 30 tablet, Rfl: 0 .  levothyroxine (SYNTHROID, LEVOTHROID) 88 MCG tablet, , Disp: , Rfl:  .  VENTOLIN HFA 108 (90 Base) MCG/ACT inhaler, , Disp: , Rfl:  .  zolpidem (AMBIEN) 10 MG tablet, TAKE 1 TABLET BY MOUTH EVERYDAY AT BEDTIME **, Disp: 30 tablet, Rfl: 0   Medication Side Effects: weight gain  Family Medical/ Social History: Changes? Yes husband is on the job in [place] she has anterior approach to cervical spine surgery.  Mother states 2 weeks but in sudden noticed to help at home as the synagogue sent food for 2 weeks patient gaining up to 20 pounds over the last year.  Son remains in jail in [place] as 1 charges delayed in the court due to [place] but he is otherwise had most charges dropped for illegal search back to him to serve no more than a year overall patient anticipating he will change this time from his cannabis involvement.  Niece who lives on their property is rejected her mother staying with the patient and then with friends wanting to leave the family having always confided in the patient leaving much sense of responsibility though without more than  mild depressive decompensation.  MENTAL HEALTH EXAM:  Height 5' 3"  (1.6 m), weight 282 lb (127.9 kg).Body mass index is 49.95 kg/m.  Others deferred for coronavirus though recently monitored in neck surgery  General Appearance: Fairly Groomed, Guarded, Meticulous and Obese  Eye Contact:  Fair  Speech:  Clear and Coherent, Normal Rate and Talkative  Volume:  Normal  Mood:  Anxious, Depressed, Dysphoric, Irritable and Worthless  Affect:  Depressed, Inappropriate, Labile, Full Range and Anxious  Thought Process:  Goal Directed, Irrelevant and Linear  Orientation:  Full (Time, Place, and Person)  Thought Content: Logical, Ilusions, Obsessions and Rumination   Suicidal Thoughts:  No  Homicidal Thoughts:  No  Memory:  Immediate;   Good Remote;   Good  Judgement:  Fair  Insight:  Fair  Psychomotor Activity:  Normal, Increased, Mannerisms and Restlessness  Concentration:  Concentration: Fair and Attention Span: Fair  Recall:  [employer] of Knowledge: Good  Language: Good  Assets:  Desire for Improvement Leisure Time Resilience Talents/Skills  ADL's:  Intact  Cognition: WNL  Prognosis:  Fair to poor    DIAGNOSES:    ICD-10-CM   1. Bipolar I disorder, current or most recent episode depressed, in partial remission with mixed features ([place])  F31.75  2. Chronic post-traumatic stress disorder  F43.12   3. Attention deficit hyperactivity disorder, combined type  F90.2   4. 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  Coping skills are reworked for all of the current stressors.  Her many [facility] with Adderall 20 mg IR tablet as 1.5 tablets every morning and 1 at noon and 1600 sent as #105  each for August 12, September 11, and October 11 for ADHD.  Xanax is E scribed as 1 mg twice daily as needed as #60 with 2 refills for PTSD.  Ambien is 10 mg nightly as #30 with 2 refills for PTSD and bipolar.  Lamictal is 200 mg every bedtime as #30 with 2 refills for bipolar and PTSD. Saphris is 5 mg twice daily sent as #60 with 2 refills for bipolar and PTSD.  Wellbutrin is 150 mg XL every morning sent as #30 with 2 refills for ADHD and bipolar depression.  She returns in 3 months for follow-up.  Delight Hoh, MD

## 2018-09-04 ENCOUNTER — Other Ambulatory Visit: Payer: Self-pay

## 2018-09-04 DIAGNOSIS — F902 Attention-deficit hyperactivity disorder, combined type: Secondary | ICD-10-CM

## 2018-09-04 DIAGNOSIS — F3175 Bipolar disorder, in partial remission, most recent episode depressed: Secondary | ICD-10-CM

## 2018-09-04 MED ORDER — BUPROPION HCL ER (XL) 150 MG PO TB24: 150.0000 mg | ORAL_TABLET | Freq: Every day | ORAL | 0 refills | Status: DC

## 2018-09-04 NOTE — Telephone Encounter (Signed)
[facility]'s Buprop[facility] XL 150 mg

## 2018-11-09 ENCOUNTER — Other Ambulatory Visit: Payer: Self-pay

## 2018-11-09 DIAGNOSIS — F3175 Bipolar disorder, in partial remission, most recent episode depressed: Secondary | ICD-10-CM

## 2018-11-09 MED ORDER — LAMOTRIGINE 200 MG PO TABS: 200.0000 mg | ORAL_TABLET | Freq: Every day | ORAL | 2 refills | Status: DC

## 2018-11-15 ENCOUNTER — Other Ambulatory Visit: Payer: Self-pay | Admitting: Psychiatry

## 2018-11-15 DIAGNOSIS — F3175 Bipolar disorder, in partial remission, most recent episode depressed: Secondary | ICD-10-CM

## 2018-11-15 DIAGNOSIS — F902 Attention-deficit hyperactivity disorder, combined type: Secondary | ICD-10-CM

## 2018-11-30 ENCOUNTER — Ambulatory Visit (INDEPENDENT_AMBULATORY_CARE_PROVIDER_SITE_OTHER): Payer: BC Managed Care – PPO | Admitting: Psychiatry

## 2018-11-30 ENCOUNTER — Encounter: Payer: Self-pay | Admitting: Psychiatry

## 2018-11-30 VITALS — Ht 63.0 in | Wt 299.0 lb

## 2018-11-30 DIAGNOSIS — F22 Delusional disorders: Secondary | ICD-10-CM

## 2018-11-30 DIAGNOSIS — F4312 Post-traumatic stress disorder, chronic: Secondary | ICD-10-CM | POA: Diagnosis not present

## 2018-11-30 DIAGNOSIS — F902 Attention-deficit hyperactivity disorder, combined type: Secondary | ICD-10-CM

## 2018-11-30 DIAGNOSIS — F3175 Bipolar disorder, in partial remission, most recent episode depressed: Secondary | ICD-10-CM | POA: Diagnosis not present

## 2018-11-30 MED ORDER — AMPHETAMINE-DEXTROAMPHETAMINE 20 MG PO TABS: ORAL_TABLET | ORAL | 0 refills | Status: DC

## 2018-11-30 MED ORDER — ZOLPIDEM TARTRATE 10 MG PO TABS: 10.0000 mg | ORAL_TABLET | Freq: Every day | ORAL | 2 refills | Status: DC

## 2018-11-30 MED ORDER — SAPHRIS 5 MG SL SUBL: 5.0000 mg | SUBLINGUAL_TABLET | Freq: Two times a day (BID) | SUBLINGUAL | 2 refills | Status: DC

## 2018-11-30 MED ORDER — ALPRAZOLAM 1 MG PO TABS: ORAL_TABLET | ORAL | 2 refills | Status: DC
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
## 2019-01-11 ENCOUNTER — Other Ambulatory Visit: Payer: Self-pay | Admitting: Psychiatry

## 2019-01-11 DIAGNOSIS — F3175 Bipolar disorder, in partial remission, most recent episode depressed: Secondary | ICD-10-CM

## 2019-01-21 ENCOUNTER — Other Ambulatory Visit: Payer: Self-pay | Admitting: Psychiatry

## 2019-01-21 DIAGNOSIS — F4312 Post-traumatic stress disorder, chronic: Secondary | ICD-10-CM

## 2019-01-23 NOTE — Telephone Encounter (Signed)
Dana Rodgers, Can you contact her pharmacy, she had Rx's submitted on 11/12 for #60 with 2 additional refills and I don't see those have been used yet. Thank you

## 2019-01-24 NOTE — Telephone Encounter (Signed)
Pharmacy said please disregard.

## 2019-02-20 DIAGNOSIS — G5600 Carpal tunnel syndrome, unspecified upper limb: Secondary | ICD-10-CM | POA: Insufficient documentation

## 2019-03-01 ENCOUNTER — Other Ambulatory Visit: Payer: Self-pay | Admitting: Psychiatry

## 2019-03-01 DIAGNOSIS — F3175 Bipolar disorder, in partial remission, most recent episode depressed: Secondary | ICD-10-CM

## 2019-03-06 ENCOUNTER — Other Ambulatory Visit: Payer: Self-pay

## 2019-03-06 DIAGNOSIS — F3175 Bipolar disorder, in partial remission, most recent episode depressed: Secondary | ICD-10-CM

## 2019-03-06 DIAGNOSIS — F902 Attention-deficit hyperactivity disorder, combined type: Secondary | ICD-10-CM

## 2019-03-06 MED ORDER — BUPROPION HCL ER (XL) 150 MG PO TB24: 150.0000 mg | ORAL_TABLET | Freq: Every day | ORAL | 0 refills | Status: DC

## 2019-04-05 ENCOUNTER — Other Ambulatory Visit: Payer: Self-pay

## 2019-04-05 DIAGNOSIS — F4312 Post-traumatic stress disorder, chronic: Secondary | ICD-10-CM

## 2019-04-05 MED ORDER — ALPRAZOLAM 1 MG PO TABS: ORAL_TABLET | ORAL | 2 refills | Status: DC

## 2019-04-05 NOTE — Telephone Encounter (Signed)
It appears that the first fill of the 11/30/2018 escription of Xanax was not logged in [facility] registry but only 1 for January and February now due for appointment and Xanax fill.  Sending a month supply of Xanax 1 mg 2 times daily #60 to [facility]

## 2019-04-09 ENCOUNTER — Other Ambulatory Visit: Payer: Self-pay

## 2019-04-09 DIAGNOSIS — F4312 Post-traumatic stress disorder, chronic: Secondary | ICD-10-CM

## 2019-04-09 DIAGNOSIS — F3175 Bipolar disorder, in partial remission, most recent episode depressed: Secondary | ICD-10-CM

## 2019-04-09 MED ORDER — ZOLPIDEM TARTRATE 10 MG PO TABS: 10.0000 mg | ORAL_TABLET | Freq: Every day | ORAL | 0 refills | Status: DC

## 2019-04-09 NOTE — Telephone Encounter (Signed)
All 3 refills of Ambien 10 mg nightly from last appointment 11/30/2018 are now dispensed including last 02/28/2019 needing another eScription for Ambien but also reminder appointment due

## 2019-04-16 ENCOUNTER — Other Ambulatory Visit: Payer: Self-pay

## 2019-04-16 ENCOUNTER — Telehealth: Payer: Self-pay | Admitting: Psychiatry

## 2019-04-16 DIAGNOSIS — F902 Attention-deficit hyperactivity disorder, combined type: Secondary | ICD-10-CM

## 2019-04-16 MED ORDER — AMPHETAMINE-DEXTROAMPHETAMINE 20 MG PO TABS: ORAL_TABLET | ORAL | 0 refills | Status: DC

## 2019-04-16 NOTE — Telephone Encounter (Signed)
Dana Rodgers called to request refill of her Adderall.  Appt 04/23/19.  [facility]

## 2019-04-16 NOTE — Telephone Encounter (Signed)
Last refill 03/09/2019, pended for Dr. Marlyne Beards to submit Has apt 04/23/2019

## 2019-04-23 ENCOUNTER — Encounter: Payer: Self-pay | Admitting: Psychiatry

## 2019-04-23 ENCOUNTER — Ambulatory Visit (INDEPENDENT_AMBULATORY_CARE_PROVIDER_SITE_OTHER): Payer: BC Managed Care – PPO | Admitting: Psychiatry

## 2019-04-23 DIAGNOSIS — F902 Attention-deficit hyperactivity disorder, combined type: Secondary | ICD-10-CM

## 2019-04-23 DIAGNOSIS — F22 Delusional disorders: Secondary | ICD-10-CM

## 2019-04-23 DIAGNOSIS — F4312 Post-traumatic stress disorder, chronic: Secondary | ICD-10-CM | POA: Diagnosis not present

## 2019-04-23 DIAGNOSIS — F3175 Bipolar disorder, in partial remission, most recent episode depressed: Secondary | ICD-10-CM

## 2019-04-23 MED ORDER — AMPHETAMINE-DEXTROAMPHETAMINE 20 MG PO TABS: ORAL_TABLET | ORAL | 0 refills | Status: DC

## 2019-04-23 MED ORDER — ZOLPIDEM TARTRATE 10 MG PO TABS: 10.0000 mg | ORAL_TABLET | Freq: Every day | ORAL | 3 refills | Status: DC

## 2019-04-23 MED ORDER — ASENAPINE MALEATE 5 MG SL SUBL: 5.0000 mg | SUBLINGUAL_TABLET | Freq: Two times a day (BID) | SUBLINGUAL | 3 refills | Status: DC

## 2019-04-23 MED ORDER — BUPROPION HCL ER (XL) 150 MG PO TB24: 150.0000 mg | ORAL_TABLET | Freq: Every day | ORAL | 1 refills | Status: DC

## 2019-04-23 MED ORDER — LAMOTRIGINE 200 MG PO TABS: 200.0000 mg | ORAL_TABLET | Freq: Every day | ORAL | 1 refills | Status: DC

## 2019-04-23 MED ORDER — ALPRAZOLAM 1 MG PO TABS: ORAL_TABLET | ORAL | 3 refills | Status: DC

## 2019-04-23 NOTE — Progress Notes (Signed)
[facility]  Patient ID: Dana Rodgers,  MRN: [id number]  PCP: Dana Pepper, MD  Date of Evaluation: 04/23/2019 Time spent:20 minutes from 1045 to 1105  Chief Complaint:  Chief Complaint    Depression; Manic Behavior; Paranoid; Anxiety; Trauma; ADHD      HISTORY/CURRENT STATUS: Dana Rodgers is provided telemedicine audiovisual appointment session including video camera individually video to video with consent with epic collateral for psychiatric interview and exam in 42-monthevaluation and management of bipolar depressed, PTSD with dissociation, ADHD, and partially treated delusional disorder somatic type.  Patient allows camera despite her PTSD and history of somatic delusions attesting to her improved function despite [place].  She states she developed a [place] therefore being a month overdue for follow-up.  She went for her 49th birthday [place].  She had collaborated with her mother to have dinner including with the sister that lives on the same property here but when she and her child arrived, she not given a seat at the table as though all were taken and had no further contact with them during that travel.  She did spend time with the oldest son Dana Rodgers  She is near tears describing separation from family but also in some ways considers such best, though she is quite isolated and alone on the property where she and husband live as husband works still out of town attempting to phone her twice during the session as he always does when he gets [place]  She has infrequent but still consistent contact with the sister's daughter [place] has cut off contact with the family otherwise having her own apartment and roommate  and steady full-time job though dropping out of school.  The patient notes that her synagogue is closed for the last year though she did celebrate Passover at home.  She is aware that her niece is participating in therapy including CBT the patient herself has not subsequently established therapy here.  She has a new tractor not yet utilized to mow at home on their property but looks forward to doing such.  Otherwise she continues all of her medication without change.  [place] registry documents last Xanax 1 mg dispensed on 04/08/2019, last Adderall on 04/16/2019 and Ambien on 04/09/2019.  She is not manic, psychotic, delirious or suicidal today.  Depression The patient presents with bipolardepression asa recurrentproblem.The current exacerbation started more than 1 monthago. The onset quality is sudden. The problem occurs intermittently.The problem has been gradually improvingsince relapse.Associated symptoms include decreased interest, insomnia, decreased concentration,fatigue,boredom, and reactively episodically sad. Associated symptoms include no helplessness,no hopelessness,not irritable,no restlessness,no appetite change,no headaches,no indigestionand no suicidal ideas.The symptoms are aggravated by social issues and family issues.Past treatments include other medications, psychotherapy, SSRIs - Selective serotonin reuptake inhibitors and TCAs - Tricyclic antidepressants.Compliance with treatment is variable and good.Past compliance problems include medical issues, difficulty with treatment plan and medication issues.Previous treatment provided moderaterelief.Risk factors include stress, a change in medications, family history, family history of mental illness, history of mental illness, major life event, prior traumatic experience and emotional abuse. Past medical history includes anxiety,bipolar disorder,mental health disorderand post-traumatic stress disorder.  Pertinent negatives include no chronic illness,no recent illness,no physical disability,no eating disorder,no depression,no obsessive-compulsive disorder,no schizophrenia,no suicide attemptsand  no head trauma.  Individual Medical History/ Review of Systems: Changes? :No The patient acknowledges that she is overdue for her general medical exam and labs noting relative to current psychiatric medications agrees to consider scheduling such soon.  Allergies: Latex and Phenobarbital  Current Medications:  Current Outpatient Medications:  .  ALPRAZolam (XANAX) 1 MG tablet, TAKE 1 TABLET (1 MG TOTAL) BY MOUTH 2 (TWO) TIMES DAILY AS NEEDED. *, Disp: 60 tablet, Rfl: 3 .  [START ON 05/16/2019] amphetamine-dextroamphetamine (ADDERALL) 20 MG tablet, Take 1.5 tablets total 30 mg every morning and 1 tablet every noon and 1600, Disp: 105 tablet, Rfl: 0 .  [START ON 06/15/2019] amphetamine-dextroamphetamine (ADDERALL) 20 MG tablet, Take 1.5 tablets total 30 mg every morning and take 1 tablet every noon and 1600, Disp: 105 tablet, Rfl: 0 .  [START ON 07/15/2019] amphetamine-dextroamphetamine (ADDERALL) 20 MG tablet, As 1.5 tablet every morning and 1 tablet every noon and 1600, Disp: 105 tablet, Rfl: 0 .  asenapine (SAPHRIS) 5 MG SUBL 24 hr tablet, Place 1 tablet (5 mg total) under the tongue 2 (two) times daily., Disp: 60 tablet, Rfl: 3 .  buPROPion (WELLBUTRIN XL) 150 MG 24 hr tablet, Take 1 tablet (150 mg total) by mouth daily after breakfast., Disp: 90 tablet, Rfl: 1 .  Cyanocobalamin 1000 MCG/ML KIT, Inject 1,000 mcg as directed once a week., Disp: , Rfl:  .  folic acid (FOLVITE) 528 MCG tablet, Take 400 mcg by mouth daily., Disp: , Rfl:  .  lamoTRIgine (LAMICTAL) 200 MG tablet, Take 1 tablet (200 mg total) by mouth at bedtime., Disp: 90 tablet, Rfl: 1 .  levothyroxine (SYNTHROID, LEVOTHROID) 88 MCG tablet, , Disp: , Rfl:  .  VENTOLIN HFA 108 (90 Base) MCG/ACT inhaler, , Disp: , Rfl:  .  zolpidem  (AMBIEN) 10 MG tablet, Take 1 tablet (10 mg total) by mouth at bedtime., Disp: 30 tablet, Rfl: 3   Medication Side Effects: none  Family Medical/ Social History: Changes? No  MENTAL HEALTH EXAM:  There were no vitals taken for this visit.There is no height or weight on file to calculate BMI.  Not present in office today.  General Appearance: N/A  Eye Contact:  N/A  Speech:  Clear and Coherent, Normal Rate and Talkative  Volume:  Normal  Mood:  Anxious, Depressed, Dysphoric, Irritable and Worthless  Affect:  Congruent, Depressed, Inappropriate, Full Range and Anxious  Thought Process:  Coherent, Goal Directed, Irrelevant, Linear and Descriptions of Associations: Tangential  Orientation:  Full (Time, Place, and Person)  Thought Content: Ilusions, Rumination and Tangential   Suicidal Thoughts:  No  Homicidal Thoughts:  No  Memory:  Immediate;   Fair Remote;   Good  Judgement:  Fair  Insight:  Fair  Psychomotor Activity:  N/A  Concentration:  Concentration: Fair and Attention Span: Fair  Recall:  Good  Fund of Knowledge: Good  Language: Good  Assets:  Intimacy Leisure Time Talents/Skills  ADL's:  Intact  Cognition: WNL  Prognosis:  Fair    DIAGNOSES:    ICD-10-CM   1. Bipolar I disorder, current or most recent episode depressed, in partial remission with mixed features (HCC)  F31.75 asenapine (SAPHRIS) 5 MG SUBL 24 hr tablet    lamoTRIgine (LAMICTAL) 200 MG tablet    buPROPion (WELLBUTRIN XL) 150 MG 24 hr tablet    zolpidem (AMBIEN) 10 MG tablet  2. Chronic post-traumatic stress disorder  F43.12 zolpidem (AMBIEN) 10 MG tablet    ALPRAZolam (XANAX) 1 MG tablet  3. Attention deficit hyperactivity disorder, combined type  F90.2 buPROPion (WELLBUTRIN XL) 150 MG 24 hr tablet    amphetamine-dextroamphetamine (ADDERALL) 20 MG tablet    amphetamine-dextroamphetamine (ADDERALL) 20 MG tablet    amphetamine-dextroamphetamine (ADDERALL) 20 MG tablet  4. 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  She is E scribed Adderall 20 mg IR tablet taking 1.5 tablets every morning and 1 tablet each noon and 1600 sent as #105 tablets each for April 28, May 28, in June 27 to [facility] for ADHD.  She is E scribed Ambien 10 mg every bedtime sent as #30 with 3 refills for insomnia especially associated with anxiety, mood, and thought disorders sent to [facility].  She is E scribed Xanax 1 mg twice daily as needed for anxiety #60 with 2 refills sent to [facility] for PTSD and thought and mood disorders.  She is E scribed Lamictal 200 mg every bedtime sent as #90 with 1 refill and Wellbutrin 150 mg XL every morning sent as #90 with 1 refill to [facility] for bipolar disorder and Lamictal also for PTSD.  She is E scribed Saphris 5 mg sublingual twice daily #60 with 3 refills to [facility] for bipolar disorder.  She returns for follow-up in 4 months or sooner if needed.  Virtual Visit via Video Note  I connected with Gwynne N Hiller on 04/23/19 at 10:40 AM EDT by a video enabled telemedicine application and verified that I am speaking with the correct person using two identifiers.  Location: Patient: Audio with video camera privately at personal residence husband calling her at least twice Provider: [facility]   I discussed the limitations of evaluation and management by telemedicine and the  availability of in person appointments. The patient expressed understanding and agreed to proceed.  History of Present Illness: 4-month evaluation and management address bipolar depressed, PTSD with dissociation, ADHD, and partially treated delusional disorder somatic type.  Patient allows camera despite her PTSD and history of somatic delusions attesting to her improved function despite having a very stressful trip to [place] several weeks ago.    Observations/Objective: Mood:  Anxious, Depressed, Dysphoric, Irritable and Worthless  Affect:  Congruent, Depressed, Inappropriate, Full Range and Anxious  Thought Process:  Coherent, Goal Directed, Irrelevant, Linear and Descriptions of Associations: Tangential  Orientation:  Full (Time, Place, and Person)  Thought Content: Ilusions, Rumination and Tangential    Assessment and Plan: Psychosupportive psychoeducation addresses family loss and conflicts for the context of PTSD and bipolar depression coping and management as other symptoms are reasonably stabilized currently including for symptom treatment matching to continue current medications.  She is E scribed Adderall 20 mg IR tablet taking 1.5 tablets every morning and 1 tablet each noon and 1600 sent as #105 tablets each for April 28, May 28, in June 27 to [facility] for ADHD.  She is E scribed Ambien 10 mg every bedtime sent as #30 with 3 refills for insomnia especially associated with anxiety, mood, and   thought disorders sent to [facility].  She is E scribed Xanax 1 mg twice daily as needed for anxiety #60 with 2 refills sent to [facility] for PTSD and thought and mood disorders.  She is E scribed Lamictal 200 mg every bedtime sent as #90 with 1 refill and Wellbutrin 150 mg XL every morning sent as #90 with 1 refill to [facility] for bipolar disorder and Lamictal also for PTSD.  She is E scribed Saphris 5 mg sublingual twice daily #60 with 3 refills to [facility] for  bipolar disorder.  Follow Up Instructions: She returns for follow-up in 4 months or sooner if needed.    I discussed the assessment and treatment plan with the patient. The patient was provided an opportunity to ask questions and all were answered. The patient agreed with the plan and demonstrated an understanding of the instructions.   The patient was advised to call back or seek an in-person evaluation if the symptoms worsen or if the condition fails to improve as anticipated.  I provided 20 minutes of non-face-to-face time during this encounter. [employer] meeting #[id number]  Meeting password: [email or web address]  Delight Hoh, MD  Delight Hoh, MD

## 2019-07-25 IMAGING — US US EXTREM  UP VENOUS*L*
1 series · 13 of 24 positions shown · non-contrast
Comparison: None

CLINICAL DATA: LEFT upper extremity swelling and pain for 1 week,
history of DVT



[Series 1: us extrem up venous*left* · 13 of 31 slices shown]
[im 1/31]
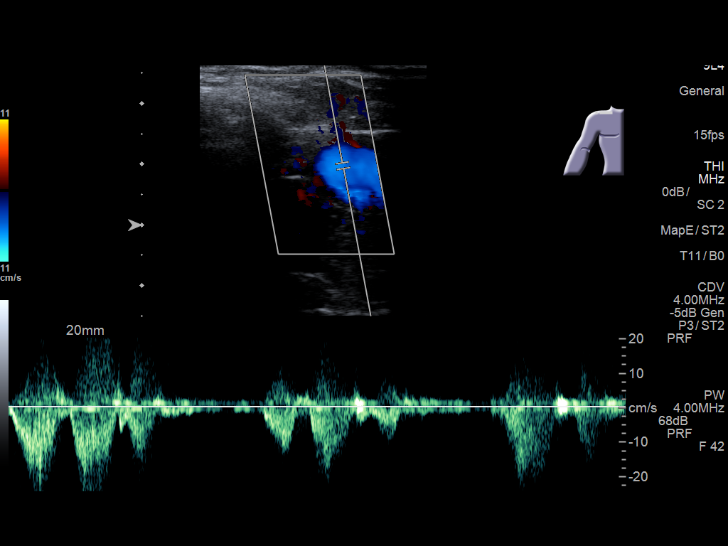
[im 3/31]
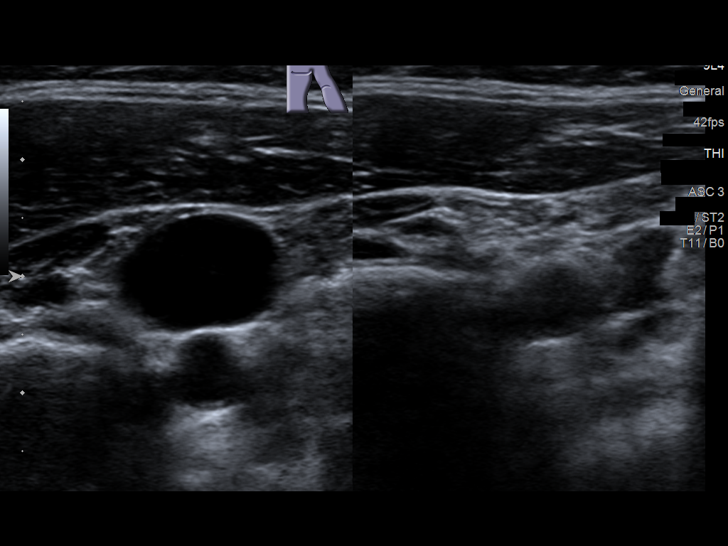
[im 6/31]
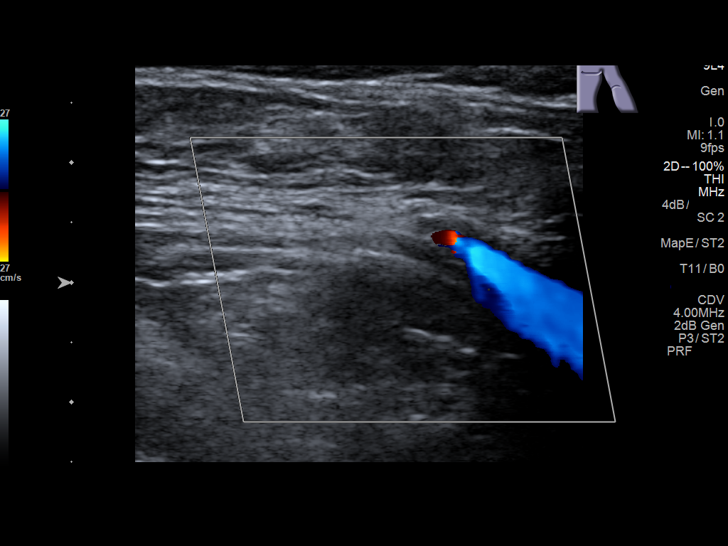
[im 8/31]
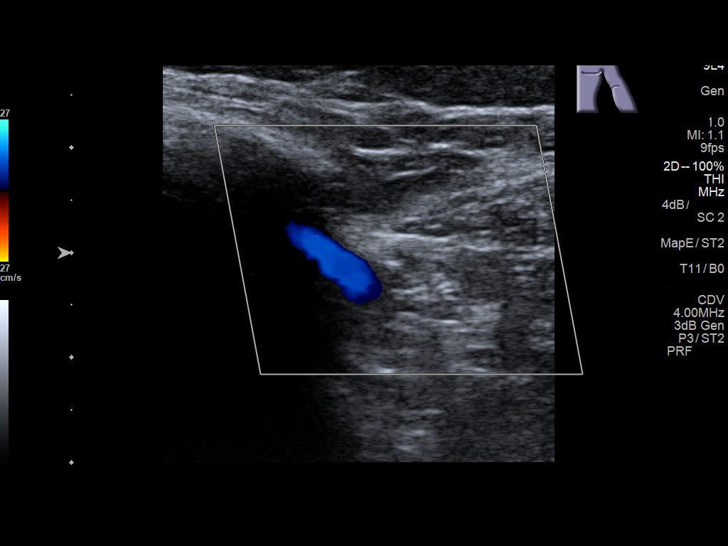
[im 11/31]
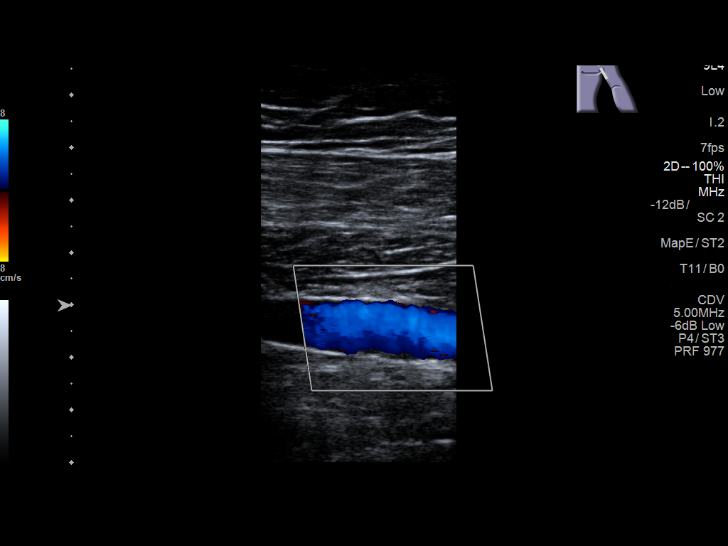
[im 14/31]
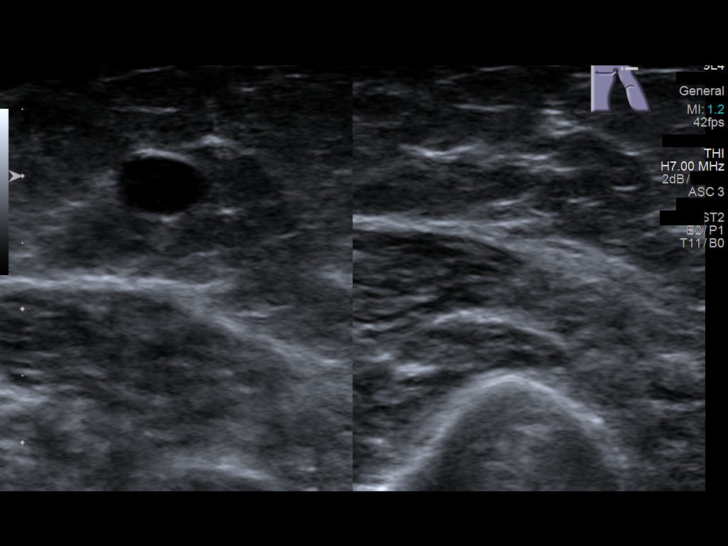
[im 16/31]
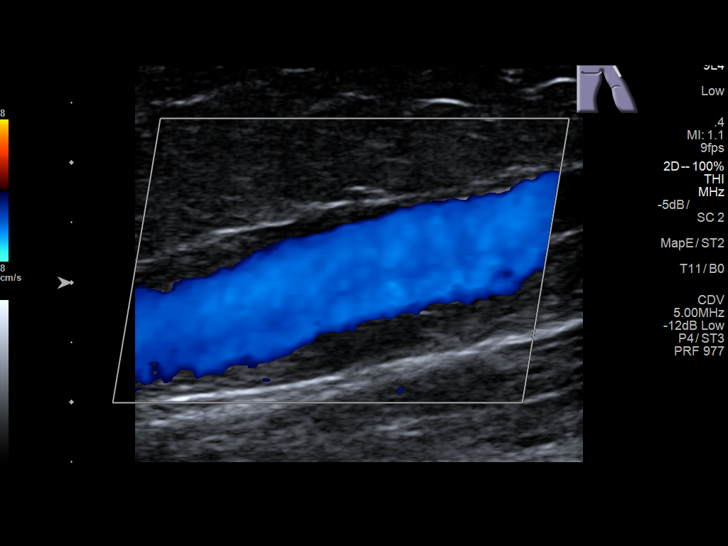
[im 17/31]
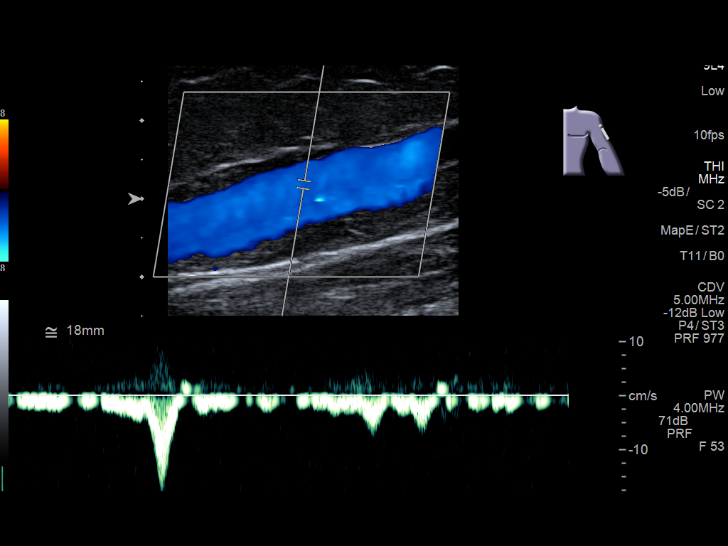
[im 20/31]
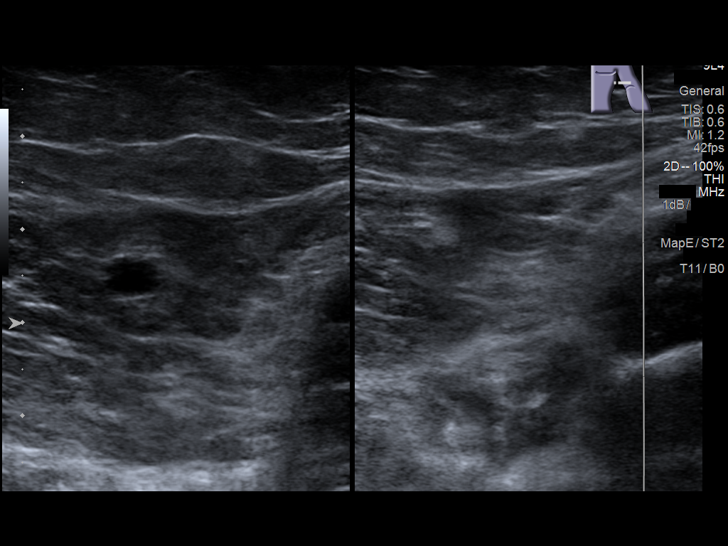
[im 23/31]
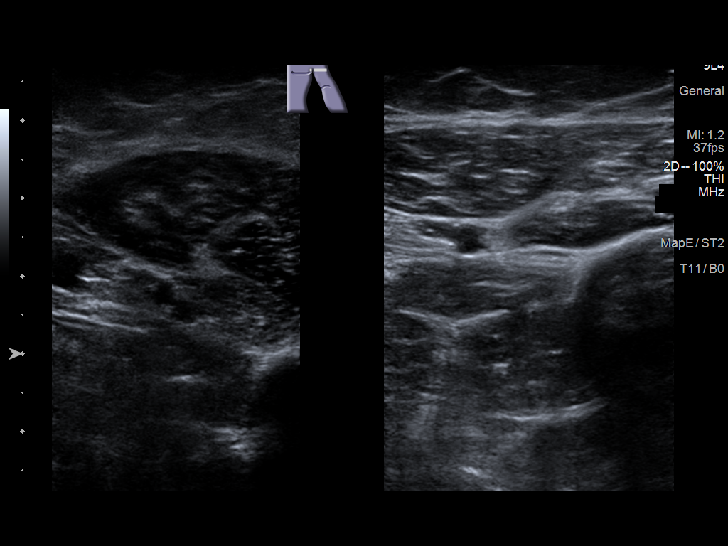
[im 25/31]
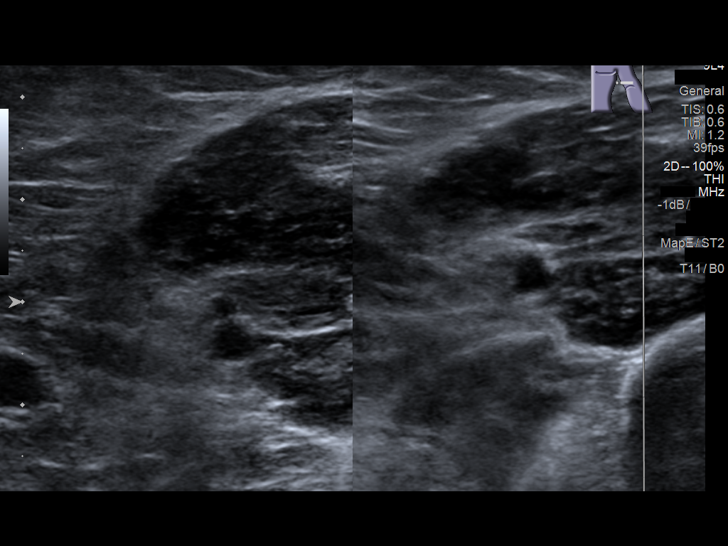
[im 28/31]
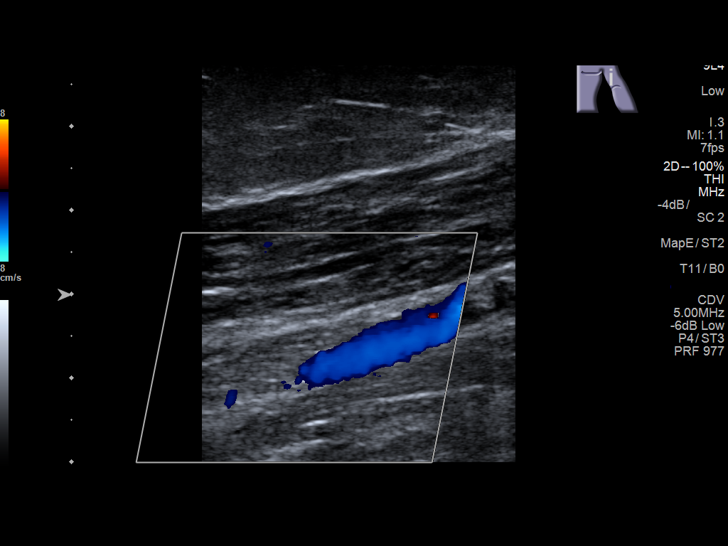
[im 31/31]
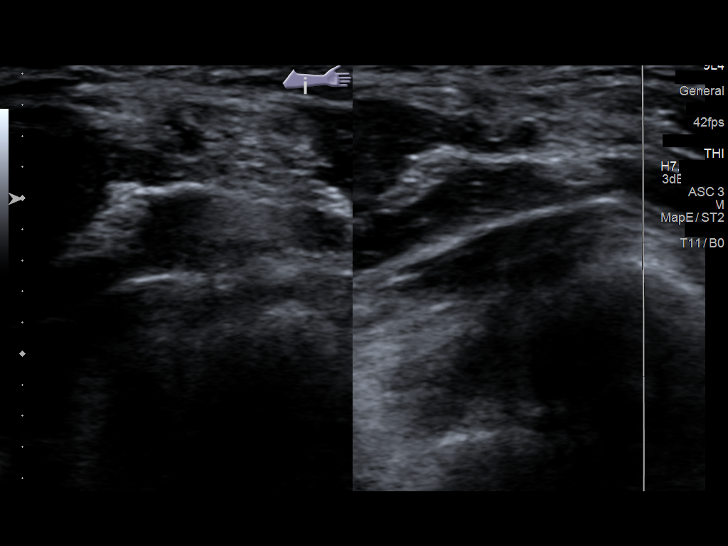

[13 of 24 positions shown; findings below may reference images not displayed]

FINDINGS: Contralateral Subclavian Vein: Respiratory phasicity is normal and
symmetric with the symptomatic side. No evidence of thrombus. Normal
compressibility.

Internal Jugular Vein: No evidence of thrombus. Normal
compressibility, respiratory phasicity and response to augmentation.

Subclavian Vein: No evidence of thrombus. Normal compressibility,
respiratory phasicity and response to augmentation.

Axillary Vein: No evidence of thrombus. Normal compressibility,
respiratory phasicity and response to augmentation.

Cephalic Vein: No evidence of thrombus. Normal compressibility,
respiratory phasicity and response to augmentation.

Basilic Vein: No evidence of thrombus. Normal compressibility,
respiratory phasicity and response to augmentation.

Brachial Veins: No evidence of thrombus. Normal compressibility,
respiratory phasicity and response to augmentation.

Radial Veins: No evidence of thrombus. Normal compressibility,
respiratory phasicity and response to augmentation.

Ulnar Veins: No evidence of thrombus. Normal compressibility,
respiratory phasicity and response to augmentation.

Venous Reflux:  None visualized.

Other Findings:  None visualized.
IMPRESSION: No evidence of DVT within the LEFT upper extremity.

## 2019-11-08 ENCOUNTER — Encounter: Payer: Self-pay | Admitting: Psychiatry

## 2019-11-13 ENCOUNTER — Other Ambulatory Visit: Payer: Self-pay

## 2019-11-13 ENCOUNTER — Ambulatory Visit (INDEPENDENT_AMBULATORY_CARE_PROVIDER_SITE_OTHER): Payer: BC Managed Care – PPO | Admitting: Psychiatry

## 2019-11-13 ENCOUNTER — Encounter: Payer: Self-pay | Admitting: Psychiatry

## 2019-11-13 VITALS — Ht 63.0 in | Wt 267.0 lb

## 2019-11-13 DIAGNOSIS — F4312 Post-traumatic stress disorder, chronic: Secondary | ICD-10-CM

## 2019-11-13 DIAGNOSIS — F22 Delusional disorders: Secondary | ICD-10-CM | POA: Diagnosis not present

## 2019-11-13 DIAGNOSIS — F3175 Bipolar disorder, in partial remission, most recent episode depressed: Secondary | ICD-10-CM | POA: Diagnosis not present

## 2019-11-13 DIAGNOSIS — F902 Attention-deficit hyperactivity disorder, combined type: Secondary | ICD-10-CM | POA: Diagnosis not present

## 2019-11-13 MED ORDER — AMPHETAMINE-DEXTROAMPHETAMINE 20 MG PO TABS: ORAL_TABLET | ORAL | 0 refills | Status: DC

## 2019-11-13 MED ORDER — ASENAPINE MALEATE 5 MG SL SUBL: 5.0000 mg | SUBLINGUAL_TABLET | Freq: Two times a day (BID) | SUBLINGUAL | 2 refills | Status: DC

## 2019-11-13 MED ORDER — BUPROPION HCL ER (XL) 150 MG PO TB24: 150.0000 mg | ORAL_TABLET | Freq: Every day | ORAL | 1 refills | Status: DC

## 2019-11-13 MED ORDER — LAMOTRIGINE 200 MG PO TABS: 200.0000 mg | ORAL_TABLET | Freq: Every day | ORAL | 1 refills | Status: DC

## 2019-11-13 MED ORDER — ALPRAZOLAM 1 MG PO TABS: ORAL_TABLET | ORAL | 2 refills | Status: DC

## 2019-11-13 MED ORDER — ZOLPIDEM TARTRATE 10 MG PO TABS: 10.0000 mg | ORAL_TABLET | Freq: Every day | ORAL | 2 refills | Status: DC

## 2019-11-13 NOTE — Progress Notes (Signed)
[facility]  Patient ID: NETHRA MEHLBERG,  MRN: [id number]  PCP: London Pepper, MD  Date of Evaluation: 11/13/2019 Time spent:20 minutes from 1010 to 59  Chief Complaint:  Chief Complaint    Depression; Paranoid; Manic Behavior; Anxiety; Trauma; ADHD      HISTORY/CURRENT STATUS: Ritta is seen onsite in office 20 minutes face-to-face individually with consent with epic collateral for psychiatric interview and exam in 57-monthevaluation and management of bipolar disorder, PTSD, ADHD, and delusional disorder.  Patient reports still having delusions currently described as though there are 20 people talking in the room when she is with one other person, otherwise abstaining from overreacting or overinterpreting the experiences.  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  Husband is home currently but she seeks to have time away from him as [place] now just wanting to be home on her own in her usual surroundings.  She is very pleased with her niece Piper who now works at [employer] doing woodworking among other jobs and the patient will see her on the job today.  Patient's husband works all over the place though [place].  Patient is due for her thyroid and general medical checkup with Dr. MOrland Mustardexpecting a battery of metabolic tests with  the thyroid.  She has made an appointment with Dr. RVella Redheadto return to care where her sister and niece also attend now upon my imminent retirement requiring case closure.  Patient thereby attempts to  contain and regulate stress so that her delusions and her anxiety are not exacerbated becoming sophisticated in her own environmental outward regulation as well as her internal self-regulation.  [place] registry documents last Adderall dispensing 08/19/2019 at which time she also got Xanax.  Her last Ambien was 05/16/2019.  She has no current mania, suicidality, overt psychosis though she maintains containment of chronic delusions, and no delirium.  She is verbally collaborative in session updating interim family and personal history as well as status of general and mental health.  She now clarifies that the somatic delusions that render her interpretation that she is in the room with 20 people when only she and mother are present are now clarified by patient to be continuous.   Depression The patient presents withbipolardepression asa recurrentproblem with most recent exacerbation 7 monthsago. The onset quality is sudden. The problem occurs intermittently.The problem has been gradually improving.Associated symptoms include somatic delusions, posttraumatic flashbacks, reexperiencing anxiety,insomnia, decreased concentration, and episodicallyreactively sad. Associated symptoms include no helplessness,no hopelessness,no fatigue, noboredom, no decreased interest, no irritability,no restlessness,no appetite change,no headaches,no indigestionand no suicidal ideas.The symptoms are aggravated by social issues and family issues.Past treatments include other medications, psychotherapy, SSRIs - Selective serotonin reuptake inhibitors and TCAs - Tricyclic antidepressants.Compliance with treatment is variable and good.Past compliance problems include medical issues, difficulty with treatment plan and medication issues.Previous treatment provided moderaterelief.Risk factors include stress, a change in medications, family history, family history of mental illness, history of mental illness,  major life event, prior traumatic experience and emotional abuse. Past  medical history includes anxiety,bipolar disorder,mental health disorderand post-traumatic stress disorder. Pertinent negatives include no chronic illness,no recent illness,no physical disability,no eating disorder,no depression,no obsessive-compulsive disorder,no schizophrenia,no suicide attemptsand no head trauma.  Individual Medical History/ Review of Systems: Changes? :Yes  with last 2 appointments telemedicine,  the last office weight was 282 pounds some 14 months ago thereby now 15 pound loss.  Patient has an appointment to see Dr. Orland Mustard for general medical exam to include thyroid, lipids, glucose and other metabolic testing.  Allergies: Latex and Phenobarbital  Current Medications:  Current Outpatient Medications:  .  ALPRAZolam (XANAX) 1 MG tablet, TAKE 1 TABLET (1 MG TOTAL) BY MOUTH 2 (TWO) TIMES DAILY AS NEEDED. *, Disp: 60 tablet, Rfl: 2 .  amphetamine-dextroamphetamine (ADDERALL) 20 MG tablet, As 1.5 tablet every morning and 1 tablet every noon and 1600, Disp: 105 tablet, Rfl: 0 .  amphetamine-dextroamphetamine (ADDERALL) 20 MG tablet, Take 1.5 tablets total 30 mg every morning and 1 tablet every noon and 1600, Disp: 105 tablet, Rfl: 0 .  [START ON 12/13/2019] amphetamine-dextroamphetamine (ADDERALL) 20 MG tablet, Take 1.5 tablets total 30 mg every morning and take 1 tablet every noon and 1600, Disp: 105 tablet, Rfl: 0 .  asenapine (SAPHRIS) 5 MG SUBL 24 hr tablet, Place 1 tablet (5 mg total) under the tongue 2 (two) times daily., Disp: 60 tablet, Rfl: 2 .  buPROPion (WELLBUTRIN XL) 150 MG 24 hr tablet, Take 1 tablet (150 mg total) by mouth daily after breakfast., Disp: 90 tablet, Rfl: 1 .  Cyanocobalamin 1000 MCG/ML KIT, Inject 1,000 mcg as directed once a week., Disp: , Rfl:  .  folic acid (FOLVITE) 992 MCG tablet, Take 400 mcg by mouth daily., Disp: , Rfl:  .  lamoTRIgine (LAMICTAL) 200 MG tablet,  Take 1 tablet (200 mg total) by mouth at bedtime., Disp: 90 tablet, Rfl: 1 .  levothyroxine (SYNTHROID, LEVOTHROID) 88 MCG tablet, , Disp: , Rfl:  .  VENTOLIN HFA 108 (90 Base) MCG/ACT inhaler, , Disp: , Rfl:  .  zolpidem (AMBIEN) 10 MG tablet, Take 1 tablet (10 mg total) by mouth at bedtime., Disp: 30 tablet, Rfl: 2  Medication Side Effects: none unless weight gain  Family Medical/ Social History: Changes? Yes she has 1 grandchild but many nieces and nephews  MENTAL HEALTH EXAM:  Height _0  (1.6 m), weight 267 lb (121.1 kg).Body mass index is 47.3 kg/m. Muscle strengths and tone 5/5, postural reflexes and gait 0/0, and AIMS = 0.  General Appearance: Casual, Guarded, Meticulous and Obese  Eye Contact:  Good  Speech:  Clear and Coherent, Normal Rate and Talkative  Volume:  Normal  Mood:  Anxious, Dysphoric and Worthless  Affect:  Congruent, Depressed, Inappropriate, Full Range and Anxious  Thought Process:  Coherent, Goal Directed, Linear and Descriptions of Associations: Tangential  Orientation:  Full (Time, Place, and Person)  Thought Content: Illogical, Delusions, Rumination and Tangential   Suicidal Thoughts:  No  Homicidal Thoughts:  No  Memory:  Immediate;   Fair Remote;   Good and Poor  Judgement:  Fair  Insight:  Fair  Psychomotor Activity:  Normal, Increased and Mannerisms  Concentration:  Concentration: Fair and Attention Span: Fair  Recall:  [employer] of Knowledge: Good  Language: Good  Assets:  Desire for Improvement Intimacy Leisure Time Resilience Talents/Skills  ADL's:  Intact  Cognition: WNL  Prognosis:  Fair    DIAGNOSES:    ICD-10-CM   1. Bipolar I disorder, current or most  recent episode depressed, in partial remission with mixed features (HCC)  F31.75 asenapine (SAPHRIS) 5 MG SUBL 24 hr tablet    buPROPion (WELLBUTRIN XL) 150 MG 24 hr tablet    lamoTRIgine (LAMICTAL) 200 MG tablet    zolpidem (AMBIEN) 10 MG tablet  2. Chronic post-traumatic  stress disorder  F43.12 ALPRAZolam (XANAX) 1 MG tablet    lamoTRIgine (LAMICTAL) 200 MG tablet    zolpidem (AMBIEN) 10 MG tablet  3. Delusional disorder, somatic type, continuous (HCC)  F22 asenapine (SAPHRIS) 5 MG SUBL 24 hr tablet  4. Attention deficit hyperactivity disorder, combined type  F90.2 amphetamine-dextroamphetamine (ADDERALL) 20 MG tablet    amphetamine-dextroamphetamine (ADDERALL) 20 MG tablet    buPROPion (WELLBUTRIN XL) 150 MG 24 hr tablet    Receiving Psychotherapy: No but previously working with Rosary Lively, LPC    RECOMMENDATIONS: Psychosupportive psychoeducation reworks the patient's significant improvement in the last 1 to 2 years after generally always being overwhelmed with the same problems prior to that.  Patient has acquired skills while still depending on medication to facilitate coping and capacity to become more socially and relatively occupationally integrated.  She expects metabolic screening for her chronic Saphris to be through Dr. Orland Mustard when he assesses her thyroid in the near future.  She is E scribed Adderall 20 mg IR tablet taking 1.5 tablet total 30 mg in the morning and 1 tablet each at noon and 1600 as #105 tablets each for October 26 and November 25 for ADHD to [facility]. She is E scribed Wellbutrin 150 mg XL every morning sent as a 90-day supply and one refill to [facility] for bipolar depression and ADHD.  She is E scribed Lamictal 200 mg tablet every bedtime sent as #90 with one refill to [facility] for bipolar and PTSD.  She is E scribed Saphris 5 mg sublingual twice daily #60 with 2 refills sent as #180 with one refill toto [facility] for bipolar and delusional disorder/PTSD.  She is E scribed Xanax 1 mg twice daily as needed for posttraumatic anxiety and agitation sent as #60 with 2 refills [facility].  She is E scribed Ambien 10 mg at bedtime #30 with [facility] for insomnia of bipolar and  posttraumatic stress.  She returns for follow-up here in 2 months and will then transfer transition to Dr. Vella Redhead as we process my imminent retirement today for the process of closure.  Delight Hoh, MD

## 2020-01-14 ENCOUNTER — Telehealth: Payer: Self-pay | Admitting: Psychiatry

## 2020-01-14 DIAGNOSIS — F902 Attention-deficit hyperactivity disorder, combined type: Secondary | ICD-10-CM

## 2020-01-14 MED ORDER — AMPHETAMINE-DEXTROAMPHETAMINE 20 MG PO TABS: ORAL_TABLET | ORAL | 0 refills | Status: DC

## 2020-01-14 NOTE — Telephone Encounter (Signed)
Office closed for holiday and provider medically ill having no contraindication in the last year for Adderall 20 mg [facility] #105 sent 2 days early for out of town travel

## 2020-01-14 NOTE — Telephone Encounter (Signed)
Pt will need her Adderall RF before 12.29. Going out of town. Will RS appt for GJ probably next week if Dr. Marlyne Beards feels better.  [facility] [place] [place]

## 2020-01-15 ENCOUNTER — Ambulatory Visit: Payer: BC Managed Care – PPO | Admitting: Psychiatry

## 2020-02-14 ENCOUNTER — Other Ambulatory Visit: Payer: Self-pay | Admitting: Psychiatry

## 2020-02-14 DIAGNOSIS — F3175 Bipolar disorder, in partial remission, most recent episode depressed: Secondary | ICD-10-CM

## 2020-02-14 DIAGNOSIS — F4312 Post-traumatic stress disorder, chronic: Secondary | ICD-10-CM

## 2020-02-15 ENCOUNTER — Telehealth: Payer: Self-pay | Admitting: Psychiatry

## 2020-02-15 DIAGNOSIS — F902 Attention-deficit hyperactivity disorder, combined type: Secondary | ICD-10-CM

## 2020-02-15 MED ORDER — AMPHETAMINE-DEXTROAMPHETAMINE 20 MG PO TABS: ORAL_TABLET | ORAL | 0 refills | Status: DC

## 2020-02-15 NOTE — Telephone Encounter (Signed)
Scripts sent

## 2020-02-15 NOTE — Telephone Encounter (Signed)
New pt transfer from [place]. Appt scheduled with you on 3/21. Pt would like a refill on Adderall. [facility] in [place].

## 2020-02-18 ENCOUNTER — Telehealth: Payer: Self-pay | Admitting: Psychiatry

## 2020-02-18 DIAGNOSIS — F3175 Bipolar disorder, in partial remission, most recent episode depressed: Secondary | ICD-10-CM

## 2020-02-18 DIAGNOSIS — F4312 Post-traumatic stress disorder, chronic: Secondary | ICD-10-CM

## 2020-02-18 MED ORDER — ZOLPIDEM TARTRATE 10 MG PO TABS: 10.0000 mg | ORAL_TABLET | Freq: Every day | ORAL | 2 refills | Status: DC

## 2020-02-18 NOTE — Telephone Encounter (Signed)
Script sent  

## 2020-02-18 NOTE — Telephone Encounter (Signed)
Next appt is 04/07/20. Dana Rodgers called and said that her Amiben was [id number], [phone] when here other meds were called in.

## 2020-02-19 NOTE — Telephone Encounter (Signed)
Can you send for Shanda Bumps, apt 03/21. Was GJ patient

## 2020-03-18 ENCOUNTER — Telehealth: Payer: Self-pay | Admitting: Psychiatry

## 2020-03-18 DIAGNOSIS — F902 Attention-deficit hyperactivity disorder, combined type: Secondary | ICD-10-CM

## 2020-03-18 MED ORDER — AMPHETAMINE-DEXTROAMPHETAMINE 20 MG PO TABS: ORAL_TABLET | ORAL | 0 refills | Status: DC

## 2020-03-18 NOTE — Telephone Encounter (Signed)
Script sent  

## 2020-03-18 NOTE — Telephone Encounter (Signed)
Next visit is 04/07/20. Requesting refill on Adderall 20 mg [facility] [place], [place] and phone # is [phone].

## 2020-03-18 NOTE — Addendum Note (Signed)
Addended by: Derenda Mis on: 03/18/2020 11:42 AM   Modules accepted: Orders

## 2020-04-07 ENCOUNTER — Ambulatory Visit: Payer: BC Managed Care – PPO | Admitting: Psychiatry

## 2020-04-18 ENCOUNTER — Telehealth: Payer: Self-pay | Admitting: Psychiatry

## 2020-04-18 NOTE — Telephone Encounter (Signed)
Next visit is 05/19/20. Requesting refill on Adderall, Ambien, Xanax, Bupropion and Lamotrigine called to:  [id number] [place], [place] - [address] [facility] Phone:  [phone]  Fax:  [phone]

## 2020-04-18 NOTE — Telephone Encounter (Signed)
Please review

## 2020-04-21 ENCOUNTER — Other Ambulatory Visit: Payer: Self-pay

## 2020-04-21 DIAGNOSIS — F902 Attention-deficit hyperactivity disorder, combined type: Secondary | ICD-10-CM

## 2020-04-21 DIAGNOSIS — F4312 Post-traumatic stress disorder, chronic: Secondary | ICD-10-CM

## 2020-04-21 DIAGNOSIS — F3175 Bipolar disorder, in partial remission, most recent episode depressed: Secondary | ICD-10-CM

## 2020-04-21 MED ORDER — BUPROPION HCL ER (XL) 150 MG PO TB24: 150.0000 mg | ORAL_TABLET | Freq: Every day | ORAL | 0 refills | Status: DC

## 2020-04-21 MED ORDER — LAMOTRIGINE 200 MG PO TABS: 200.0000 mg | ORAL_TABLET | Freq: Every day | ORAL | 0 refills | Status: DC

## 2020-04-21 NOTE — Telephone Encounter (Signed)
Rx's sent and pended for review

## 2020-04-22 MED ORDER — ALPRAZOLAM 1 MG PO TABS: ORAL_TABLET | ORAL | 0 refills | Status: DC

## 2020-04-22 MED ORDER — ZOLPIDEM TARTRATE 10 MG PO TABS: 10.0000 mg | ORAL_TABLET | Freq: Every day | ORAL | 0 refills | Status: DC

## 2020-04-22 MED ORDER — AMPHETAMINE-DEXTROAMPHETAMINE 20 MG PO TABS: ORAL_TABLET | ORAL | 0 refills | Status: DC

## 2020-05-19 ENCOUNTER — Ambulatory Visit (INDEPENDENT_AMBULATORY_CARE_PROVIDER_SITE_OTHER): Payer: BC Managed Care – PPO | Admitting: Psychiatry

## 2020-05-19 ENCOUNTER — Encounter: Payer: Self-pay | Admitting: Psychiatry

## 2020-05-19 ENCOUNTER — Other Ambulatory Visit: Payer: Self-pay

## 2020-05-19 VITALS — BP 132/86 | HR 83

## 2020-05-19 DIAGNOSIS — F3175 Bipolar disorder, in partial remission, most recent episode depressed: Secondary | ICD-10-CM

## 2020-05-19 DIAGNOSIS — F902 Attention-deficit hyperactivity disorder, combined type: Secondary | ICD-10-CM

## 2020-05-19 DIAGNOSIS — F4312 Post-traumatic stress disorder, chronic: Secondary | ICD-10-CM | POA: Diagnosis not present

## 2020-05-19 MED ORDER — ZOLPIDEM TARTRATE 10 MG PO TABS: 10.0000 mg | ORAL_TABLET | Freq: Every day | ORAL | 1 refills | Status: DC

## 2020-05-19 MED ORDER — ALPRAZOLAM 1 MG PO TABS: ORAL_TABLET | ORAL | 0 refills | Status: DC

## 2020-05-19 MED ORDER — ASENAPINE MALEATE 5 MG SL SUBL: 5.0000 mg | SUBLINGUAL_TABLET | Freq: Two times a day (BID) | SUBLINGUAL | 2 refills | Status: DC

## 2020-05-19 MED ORDER — AMPHETAMINE-DEXTROAMPHETAMINE 20 MG PO TABS: ORAL_TABLET | ORAL | 0 refills | Status: DC

## 2020-05-19 MED ORDER — LAMOTRIGINE 200 MG PO TABS
200.0000 mg | ORAL_TABLET | Freq: Every day | ORAL | 1 refills | Status: DC
Start: 2020-05-19 — End: 2020-06-24

## 2020-05-19 MED ORDER — BUPROPION HCL ER (XL) 150 MG PO TB24: 150.0000 mg | ORAL_TABLET | Freq: Every day | ORAL | 1 refills | Status: DC

## 2020-05-19 NOTE — Progress Notes (Signed)
Dana Rodgers [id number] [birth year] 50 y.o.  Subjective:   Patient ID:  Dana Rodgers is a 50 y.o. (DOB [birth year]) female.  Chief Complaint:  Chief Complaint  Patient presents with  . Follow-up    H/o anxiety, mood disturbance, and ADHD    HPI Dana Rodgers presents to the office today for follow-up of Bipolar D/O, PTSD, ADHD, and psychosis.  Pt previously seen by Dr. Creig Rodgers and Rodgers is being transferred to this provider due to Dr. Creig Rodgers' retirement. Saw Dr. Creig Rodgers for 6 years and saw Dr. Candis Rodgers. Reports that she has not had Saphris for several months.  She reports that she took a part-time job at [employer] about 3 weeks ago. and has been standing and walking more.  [place] [facility] Visit from 05/19/2020 in [facility]  AIMS Total Score 1      She reports that she is feeling "ok." She reports that she continues to experience "hallucinations." She reports that she constantly hears people around her that are not directly talking to her. She reports that she occasionally sees things that others don't, such as seeing an accident up ahead. She thinks that she will hear her husband talking to her when he says he has not talked to her. She reports that Saphris helped somewhat and became cost prohibitive. She reports that psychosis started prior to [place].   She reports that she has had increased anxiety with return to work and being around others. 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 Has had 2 recent panic attacks and one was more severe than she has experienced in awhile and lasted 1-2 days in duration. She reports that she typically does not like to take Xanax prn. She reports that panic typically occurs without apparent trigger. She reports, "with COVID I felt trapped." She reports that things are written down have to be a certain way. If she crosses something off her list, she will have to re-write the  list.  She reports that she will periodically tear up or cry without apparent trigger. She reports that she has had several periods of sad mood and "will go to bed and not get up for 3-4 days." She will have difficulty sleeping and will eat more with depression. She reports gaining 45 lbs since October. She reports that depression was better controlled while on Saphris. She reports that at times mood will shift abruptly from depression to mania. She reports periods of mania with increased spending and has limited this now that husband points it out. Has had more depressive s/s than mania. She reports that Adderall is helpful for her concentration and has been on same dose long-term. She reports that she sometimes does not take 3rd dose of Adderall if she is not at work or doing something that requires focus. Denies SI.   She takes Rodgers of her 19 yo nephew and he has lived with her for 7 year. She plans to transition her Rodgers to Dr. Toy Rodgers where nephew receives Rodgers.   Working about 20-25 hours a week.    Past Psychiatric Medication Trials: Saphris Ambien Lamictal Wellbutrin XL Paxil Adderall Xanax  Review of Systems:  Review of Systems  Musculoskeletal: Negative for gait problem.       Knee pain  Neurological: Negative for tremors.  Psychiatric/Behavioral:       Please refer to HPI    Medications: I have reviewed  the patient's current medications.  Current Outpatient Medications  Medication Sig Dispense Refill  . asenapine (SAPHRIS) 5 MG SUBL 24 hr tablet Place 1 tablet (5 mg total) under the tongue 2 (two) times daily. 60 tablet 2  . [START ON 05/20/2020] ALPRAZolam (XANAX) 1 MG tablet Take 1 tablet (1 mg) by mouth twice a day as needed for anxiety 60 tablet 0  . amphetamine-dextroamphetamine (ADDERALL) 20 MG tablet Take 1.5 tablets total 30 mg every morning and 1 tablet every noon and 1600 105 tablet 0  . [START ON 06/17/2020] amphetamine-dextroamphetamine (ADDERALL) 20 MG tablet Take  1.5 tablets total 30 mg every morning and take 1 tablet every noon and 1600 105 tablet 0  . [START ON 05/20/2020] amphetamine-dextroamphetamine (ADDERALL) 20 MG tablet As 1.5 tablet every morning and 1 tablet every noon and 1600 105 tablet 0  . buPROPion (WELLBUTRIN XL) 150 MG 24 hr tablet Take 1 tablet (150 mg total) by mouth daily after breakfast. 30 tablet 1  . Cyanocobalamin 1000 MCG/ML KIT Inject 1,000 mcg as directed once a week. (Patient not taking: Reported on 05/19/2020)    . folic acid (FOLVITE) 381 MCG tablet Take 400 mcg by mouth daily. (Patient not taking: Reported on 05/19/2020)    . lamoTRIgine (LAMICTAL) 200 MG tablet Take 1 tablet (200 mg total) by mouth at bedtime. 30 tablet 1  . levothyroxine (SYNTHROID, LEVOTHROID) 88 MCG tablet  (Patient not taking: Reported on 05/19/2020)    . VENTOLIN HFA 108 (90 Base) MCG/ACT inhaler     . zolpidem (AMBIEN) 10 MG tablet Take 1 tablet (10 mg total) by mouth at bedtime. 30 tablet 1   No current facility-administered medications for this visit.    Medication Side Effects: Other: Twitching in feet and toes Constipation with Adderall   Allergies:  Allergies  Allergen Reactions  . Latex Anaphylaxis  . Phenobarbital Other (See Comments)    Caused seizure    Past Medical History:  Diagnosis Date  . ADD (attention deficit disorder)   . Bipolar disorder ([place])   . Clotting disorder [facility])     Past Medical History, Surgical history, Social history, and Family history were reviewed and updated as appropriate.   Please see review of systems for further details on the patient's review from today.   Objective:   Physical Exam:  BP 132/86   Pulse 83   Physical Exam Constitutional:      General: She is not in acute distress. Musculoskeletal:        General: No deformity.  Neurological:     Mental Status: She is alert and oriented to person, place, and time.     Coordination: Coordination normal.  Psychiatric:        Attention and  Perception: Attention and perception normal. She does not perceive auditory or visual hallucinations.        Mood and Affect: Mood is anxious. Mood is not depressed. Affect is not labile, blunt, angry or inappropriate.        Speech: Speech normal.        Behavior: Behavior normal.        Thought Content: Thought content normal. Thought content is not paranoid or delusional. Thought content does not include homicidal or suicidal ideation. Thought content does not include homicidal or suicidal plan.        Cognition and Memory: Cognition and memory normal.        Judgment: Judgment normal.     Comments: Insight intact  Lab Review:  No results found for: NA, K, CL, CO2, GLUCOSE, BUN, CREATININE, CALCIUM, PROT, ALBUMIN, AST, ALT, ALKPHOS, BILITOT, GFRNONAA, GFRAA  No results found for: WBC, RBC, HGB, HCT, PLT, MCV, MCH, MCHC, RDW, LYMPHSABS, MONOABS, EOSABS, BASOSABS  No results found for: POCLITH, LITHIUM   No results found for: PHENYTOIN, PHENOBARB, VALPROATE, CBMZ   .res Assessment: Plan:   Will re-submit Saphris under generic name to determine if insurance will cover. Recommend resuming Saphris 5 mg BID if covered by insurance.  Continue Wellbutrin XL 150 mg po qd for depression.  Continue Lamictal 200 mg daily for mood s/s.  Continue Ambien 10 mg at bedtime for insomnia.  Continue Adderall 20 mg 1.5 tablets in the morning and 1 tablet at noon and 1600 for ADHD.  Pt plans to establish Rodgers with Dr. Toy Rodgers and has apt scheduled for next month. Advised pt to contact office with any needs or questions that may arise before she sees Dr. Toy Rodgers.    Dana Rodgers was seen today for follow-up.  Diagnoses and all orders for this visit:  Bipolar I disorder, current or most recent episode depressed, in partial remission with mixed features (HCC) -     asenapine (SAPHRIS) 5 MG SUBL 24 hr tablet; Place 1 tablet (5 mg total) under the tongue 2 (two) times daily. -     buPROPion (WELLBUTRIN XL) 150 MG  24 hr tablet; Take 1 tablet (150 mg total) by mouth daily after breakfast. -     lamoTRIgine (LAMICTAL) 200 MG tablet; Take 1 tablet (200 mg total) by mouth at bedtime. -     zolpidem (AMBIEN) 10 MG tablet; Take 1 tablet (10 mg total) by mouth at bedtime.  Chronic post-traumatic stress disorder -     ALPRAZolam (XANAX) 1 MG tablet; Take 1 tablet (1 mg) by mouth twice a day as needed for anxiety -     lamoTRIgine (LAMICTAL) 200 MG tablet; Take 1 tablet (200 mg total) by mouth at bedtime. -     zolpidem (AMBIEN) 10 MG tablet; Take 1 tablet (10 mg total) by mouth at bedtime.  Attention deficit hyperactivity disorder, combined type -     amphetamine-dextroamphetamine (ADDERALL) 20 MG tablet; Take 1.5 tablets total 30 mg every morning and take 1 tablet every noon and 1600 -     amphetamine-dextroamphetamine (ADDERALL) 20 MG tablet; As 1.5 tablet every morning and 1 tablet every noon and 1600 -     buPROPion (WELLBUTRIN XL) 150 MG 24 hr tablet; Take 1 tablet (150 mg total) by mouth daily after breakfast.     Please see After Visit Summary for patient specific instructions.  No future appointments.  No orders of the defined types were placed in this encounter.   -------------------------------

## 2020-05-19 NOTE — Progress Notes (Signed)
   05/19/20 0952  Facial and Oral Movements  Muscles of Facial Expression 0  Lips and Perioral Area 0  Jaw 0  Tongue 0  Extremity Movements  Upper (arms, wrists, hands, fingers) 0  Lower (legs, knees, ankles, toes) 0  Trunk Movements  Neck, shoulders, hips 0  Overall Severity  Severity of abnormal movements (highest score from questions above) 0  Incapacitation due to abnormal movements 0  Patient's awareness of abnormal movements (rate only patient's report) 1  AIMS Total Score  AIMS Total Score 1

## 2020-06-20 ENCOUNTER — Other Ambulatory Visit: Payer: Self-pay | Admitting: Psychiatry

## 2020-06-20 DIAGNOSIS — F4312 Post-traumatic stress disorder, chronic: Secondary | ICD-10-CM

## 2020-06-20 DIAGNOSIS — F3175 Bipolar disorder, in partial remission, most recent episode depressed: Secondary | ICD-10-CM

## 2020-07-13 ENCOUNTER — Other Ambulatory Visit: Payer: Self-pay | Admitting: Psychiatry

## 2020-07-13 DIAGNOSIS — F3175 Bipolar disorder, in partial remission, most recent episode depressed: Secondary | ICD-10-CM

## 2020-07-18 ENCOUNTER — Other Ambulatory Visit: Payer: Self-pay | Admitting: Psychiatry

## 2020-07-18 DIAGNOSIS — F3175 Bipolar disorder, in partial remission, most recent episode depressed: Secondary | ICD-10-CM

## 2020-07-18 DIAGNOSIS — F4312 Post-traumatic stress disorder, chronic: Secondary | ICD-10-CM

## 2020-07-22 ENCOUNTER — Other Ambulatory Visit: Payer: Self-pay | Admitting: Psychiatry

## 2020-07-22 DIAGNOSIS — F902 Attention-deficit hyperactivity disorder, combined type: Secondary | ICD-10-CM

## 2020-07-22 DIAGNOSIS — F4312 Post-traumatic stress disorder, chronic: Secondary | ICD-10-CM

## 2020-07-22 DIAGNOSIS — F3175 Bipolar disorder, in partial remission, most recent episode depressed: Secondary | ICD-10-CM

## 2020-07-22 MED ORDER — AMPHETAMINE-DEXTROAMPHETAMINE 20 MG PO TABS: ORAL_TABLET | ORAL | 0 refills | Status: DC

## 2020-07-22 NOTE — Telephone Encounter (Signed)
Pt called and said that she also needs her adderall refilled along with her [place]

## 2020-08-18 ENCOUNTER — Other Ambulatory Visit: Payer: Self-pay | Admitting: Psychiatry

## 2020-08-18 DIAGNOSIS — F3175 Bipolar disorder, in partial remission, most recent episode depressed: Secondary | ICD-10-CM

## 2020-08-18 DIAGNOSIS — F902 Attention-deficit hyperactivity disorder, combined type: Secondary | ICD-10-CM

## 2020-08-19 ENCOUNTER — Other Ambulatory Visit: Payer: Self-pay | Admitting: Adult Health

## 2020-08-19 DIAGNOSIS — F4312 Post-traumatic stress disorder, chronic: Secondary | ICD-10-CM

## 2020-08-20 NOTE — Telephone Encounter (Signed)
Last filled 6/30

## 2020-08-21 NOTE — Telephone Encounter (Signed)
Last filled 6/30

## 2020-08-21 NOTE — Telephone Encounter (Signed)
Send to [place].

## 2020-08-22 ENCOUNTER — Other Ambulatory Visit: Payer: Self-pay | Admitting: Psychiatry

## 2020-08-22 ENCOUNTER — Telehealth: Payer: Self-pay | Admitting: Psychiatry

## 2020-08-22 DIAGNOSIS — F4312 Post-traumatic stress disorder, chronic: Secondary | ICD-10-CM

## 2020-08-22 DIAGNOSIS — F3175 Bipolar disorder, in partial remission, most recent episode depressed: Secondary | ICD-10-CM

## 2020-08-22 DIAGNOSIS — F902 Attention-deficit hyperactivity disorder, combined type: Secondary | ICD-10-CM

## 2020-08-22 NOTE — Telephone Encounter (Signed)
Pt stated she had covid and they rescheduled her appt to October.She is going to make an appt with the admin staff to see you

## 2020-08-22 NOTE — Telephone Encounter (Signed)
Are you still sending her Rx?I saw that the last note stated "Pt plans to establish care with Dr. Evelene Croon and has apt scheduled for next month"

## 2020-08-22 NOTE — Telephone Encounter (Signed)
LVM for pt to return call

## 2020-08-22 NOTE — Telephone Encounter (Signed)
Last visit 06/08/20. Requesting refill on Adderall called to:  [id number] [place], [place] - [address] [facility]  Phone:  [phone]  Fax:  [phone]

## 2020-08-25 MED ORDER — AMPHETAMINE-DEXTROAMPHETAMINE 20 MG PO TABS: ORAL_TABLET | ORAL | 0 refills | Status: DC

## 2020-08-25 NOTE — Telephone Encounter (Signed)
Has apt 8/10. Script sent.

## 2020-08-27 ENCOUNTER — Other Ambulatory Visit: Payer: Self-pay

## 2020-08-27 ENCOUNTER — Ambulatory Visit: Payer: BC Managed Care – PPO | Admitting: Psychiatry

## 2020-08-27 DIAGNOSIS — F902 Attention-deficit hyperactivity disorder, combined type: Secondary | ICD-10-CM

## 2020-08-27 MED ORDER — AMPHETAMINE-DEXTROAMPHETAMINE 20 MG PO TABS: ORAL_TABLET | ORAL | 0 refills | Status: DC

## 2020-09-05 ENCOUNTER — Ambulatory Visit: Payer: BC Managed Care – PPO | Admitting: Psychiatry

## 2020-09-05 ENCOUNTER — Encounter: Payer: Self-pay | Admitting: Psychiatry

## 2020-09-05 ENCOUNTER — Other Ambulatory Visit: Payer: Self-pay

## 2020-09-05 DIAGNOSIS — F4312 Post-traumatic stress disorder, chronic: Secondary | ICD-10-CM

## 2020-09-05 DIAGNOSIS — F902 Attention-deficit hyperactivity disorder, combined type: Secondary | ICD-10-CM

## 2020-09-05 DIAGNOSIS — F3175 Bipolar disorder, in partial remission, most recent episode depressed: Secondary | ICD-10-CM | POA: Diagnosis not present

## 2020-09-05 MED ORDER — ALPRAZOLAM 1 MG PO TABS: ORAL_TABLET | ORAL | 2 refills | Status: DC

## 2020-09-05 MED ORDER — BUPROPION HCL ER (XL) 150 MG PO TB24: 150.0000 mg | ORAL_TABLET | Freq: Every day | ORAL | 0 refills | Status: DC

## 2020-09-05 MED ORDER — LAMOTRIGINE 200 MG PO TABS: ORAL_TABLET | ORAL | 0 refills | Status: DC

## 2020-09-05 MED ORDER — ZOLPIDEM TARTRATE 10 MG PO TABS: ORAL_TABLET | ORAL | 2 refills | Status: DC

## 2020-09-05 MED ORDER — AMPHETAMINE-DEXTROAMPHETAMINE 20 MG PO TABS: ORAL_TABLET | ORAL | 0 refills | Status: DC

## 2020-09-05 MED ORDER — ASENAPINE MALEATE 5 MG SL SUBL: 5.0000 mg | SUBLINGUAL_TABLET | Freq: Two times a day (BID) | SUBLINGUAL | 0 refills | Status: DC

## 2020-09-05 NOTE — Progress Notes (Signed)
Dana Rodgers [id number] 1970/12/28 50 y.o.  Subjective:   Patient ID:  Dana Rodgers is a 50 y.o. (DOB 01-07-1971) female.  Chief Complaint:  Chief Complaint  Patient presents with   Follow-up    ADHD, Anxiety, mood disturbance, psychosis     HPI Dana Rodgers presents to the office today for follow-up of Bipolar D/O, PTSD, ADHD, and psychosis. Has apt with Dr. Toy Care and her initial apt has to be re-scheduled because she had COVID for the second time. She reports that Asenapine has helped with her mood and hallucinations. She reports that she has had a few hallucinations at times during increased stress. She reports that she frequently hears people talking and having conversations with one another. Denies hearing anyone talking to directly to her. She reports some mild paranoia. She has some anxiety periodically. She reports that she has had some panic attacks- "nothing that has lasted more than a day." She reports anxiety in response to being out of her routine and in response to schedule changes. She reports "I am very regimented" and keeps a written schedule. She reports that compulsions have been unchanged. She reports sleeping 4-5 hours with awakenings. Reports gaining 50 lbs over the last 8 months and attributes this to "stress eating." Reports that she is starting to walk again and changing her diet. Energy and motivation have been improving and she is now incorporating activity and healthy eating into her schedule. She reports that she had some excessive spending last month and reports that she took $5,000 out of savings. She reports that her husband now will help monitor this. She reports that impulsivity resolved after he asked her about spending. She reports concentration is adequate with Adderall. She does not consistently take Adderall second dose. Denies SI.   Son's dog is at the animal hospital. She is concerned that son will not do well if something happens to her dog.   Past  Psychiatric Medication Trials: Saphris Ambien Lamictal Wellbutrin XL Paxil Adderall Xanax  AIMS    Flowsheet Row Office Visit from 09/05/2020 in [place] Office Visit from 05/19/2020 in [place] Total Score 0 1        Review of Systems:  Review of Systems  Cardiovascular:  Negative for palpitations.  Musculoskeletal:  Negative for gait problem.  Neurological:  Negative for tremors.  Psychiatric/Behavioral:         Please refer to HPI   Medications: I have reviewed the patient's current medications.  Current Outpatient Medications  Medication Sig Dispense Refill   [START ON 09/18/2020] ALPRAZolam (XANAX) 1 MG tablet TAKE 1 TABLET (1 MG TOTAL) BY MOUTH 2 (TWO) TIMES DAILY AS NEEDED. * 60 tablet 2   amphetamine-dextroamphetamine (ADDERALL) 20 MG tablet Take 1.5 tablets total 30 mg every morning and take 1 tablet every noon and 1600 105 tablet 0   [START ON 10/31/2020] amphetamine-dextroamphetamine (ADDERALL) 20 MG tablet As 1.5 tablet every morning and 1 tablet every noon and 1600 105 tablet 0   [START ON 10/03/2020] amphetamine-dextroamphetamine (ADDERALL) 20 MG tablet Take 1.5 tablets total 30 mg every morning and 1 tablet every noon and 1600 105 tablet 0   asenapine (SAPHRIS) 5 MG SUBL 24 hr tablet Place 1 tablet (5 mg total) under the tongue 2 (two) times daily. 180 tablet 0   buPROPion (WELLBUTRIN XL) 150 MG 24 hr tablet Take 1 tablet (150 mg total) by mouth daily after breakfast. 90 tablet 0   Cyanocobalamin  1000 MCG/ML KIT Inject 1,000 mcg as directed once a week. (Patient not taking: No sig reported)     folic acid (FOLVITE) 169 MCG tablet Take 400 mcg by mouth daily. (Patient not taking: No sig reported)     lamoTRIgine (LAMICTAL) 200 MG tablet TAKE 1 TABLET BY MOUTH EVERYDAY AT BEDTIME 90 tablet 0   levothyroxine (SYNTHROID, LEVOTHROID) 88 MCG tablet  (Patient not taking: No sig reported)     VENTOLIN HFA 108 (90 Base) MCG/ACT  inhaler      [START ON 10/15/2020] zolpidem (AMBIEN) 10 MG tablet TAKE 1 TABLET BY MOUTH EVERYDAY AT BEDTIME 30 tablet 2   No current facility-administered medications for this visit.    Medication Side Effects: None  Allergies:  Allergies  Allergen Reactions   Latex Anaphylaxis   Phenobarbital Other (See Comments)    Caused seizure    Past Medical History:  Diagnosis Date   ADD (attention deficit disorder)    Bipolar disorder (HCC)    Clotting disorder ([place])     Past Medical History, Surgical history, Social history, and Family history were reviewed and updated as appropriate.   Please see review of systems for further details on the patient's review from today.   Objective:   Physical Exam:  BP (!) 148/90   Pulse 72   Physical Exam Constitutional:      General: She is not in acute distress. Musculoskeletal:        General: No deformity.  Neurological:     Mental Status: She is alert and oriented to person, place, and time.     Coordination: Coordination normal.  Psychiatric:        Attention and Perception: Attention and perception normal. She does not perceive auditory or visual hallucinations.        Mood and Affect: Mood normal. Mood is not anxious or depressed. Affect is not labile, blunt, angry or inappropriate.        Speech: Speech normal.        Behavior: Behavior normal.        Thought Content: Thought content is not delusional. Thought content does not include homicidal or suicidal ideation. Thought content does not include homicidal or suicidal plan.        Cognition and Memory: Cognition and memory normal.        Judgment: Judgment normal.     Comments: Insight intact Mild paranoia    Lab Review:  No results found for: NA, K, CL, CO2, GLUCOSE, BUN, CREATININE, CALCIUM, PROT, ALBUMIN, AST, ALT, ALKPHOS, BILITOT, GFRNONAA, GFRAA  No results found for: WBC, RBC, HGB, HCT, PLT, MCV, MCH, MCHC, RDW, LYMPHSABS, MONOABS, EOSABS, BASOSABS  No results  found for: POCLITH, LITHIUM   No results found for: PHENYTOIN, PHENOBARB, VALPROATE, CBMZ   .res Assessment: Plan:   Will continue current plan of care since target signs and symptoms are well controlled without any tolerability issues. Pt plans to transfer care to Dr. Toy Care in October. Advised pt to contact office if she needs any additional refills or has worsening s/s prior to establishing care with Dr. Toy Care.   Dana Rodgers was seen today for follow-up.  Diagnoses and all orders for this visit:  Chronic post-traumatic stress disorder -     ALPRAZolam (XANAX) 1 MG tablet; TAKE 1 TABLET (1 MG TOTAL) BY MOUTH 2 (TWO) TIMES DAILY AS NEEDED. * -     lamoTRIgine (LAMICTAL) 200 MG tablet; TAKE 1 TABLET BY MOUTH EVERYDAY AT BEDTIME -  zolpidem (AMBIEN) 10 MG tablet; TAKE 1 TABLET BY MOUTH EVERYDAY AT BEDTIME  Bipolar I disorder, current or most recent episode depressed, in partial remission with mixed features (HCC) -     buPROPion (WELLBUTRIN XL) 150 MG 24 hr tablet; Take 1 tablet (150 mg total) by mouth daily after breakfast. -     lamoTRIgine (LAMICTAL) 200 MG tablet; TAKE 1 TABLET BY MOUTH EVERYDAY AT BEDTIME -     zolpidem (AMBIEN) 10 MG tablet; TAKE 1 TABLET BY MOUTH EVERYDAY AT BEDTIME -     asenapine (SAPHRIS) 5 MG SUBL 24 hr tablet; Place 1 tablet (5 mg total) under the tongue 2 (two) times daily.  Attention deficit hyperactivity disorder, combined type -     buPROPion (WELLBUTRIN XL) 150 MG 24 hr tablet; Take 1 tablet (150 mg total) by mouth daily after breakfast. -     amphetamine-dextroamphetamine (ADDERALL) 20 MG tablet; As 1.5 tablet every morning and 1 tablet every noon and 1600 -     amphetamine-dextroamphetamine (ADDERALL) 20 MG tablet; Take 1.5 tablets total 30 mg every morning and 1 tablet every noon and 1600    Please see After Visit Summary for patient specific instructions.  No future appointments.   No orders of the defined types were placed in this  encounter.   -------------------------------

## 2020-09-05 NOTE — Progress Notes (Signed)
   09/05/20 1149  Facial and Oral Movements  Muscles of Facial Expression 0  Lips and Perioral Area 0  Jaw 0  Tongue 0  Extremity Movements  Upper (arms, wrists, hands, fingers) 0  Lower (legs, knees, ankles, toes) 0  Trunk Movements  Neck, shoulders, hips 0  Overall Severity  Severity of abnormal movements (highest score from questions above) 0  Incapacitation due to abnormal movements 0  Patient's awareness of abnormal movements (rate only patient's report) 0  AIMS Total Score  AIMS Total Score 0

## 2020-10-09 ENCOUNTER — Other Ambulatory Visit: Payer: Self-pay | Admitting: Psychiatry

## 2020-10-09 DIAGNOSIS — F3175 Bipolar disorder, in partial remission, most recent episode depressed: Secondary | ICD-10-CM

## 2020-10-10 NOTE — Telephone Encounter (Signed)
Last filled 8/28

## 2020-10-21 ENCOUNTER — Other Ambulatory Visit: Payer: Self-pay | Admitting: Psychiatry

## 2020-10-21 DIAGNOSIS — F3175 Bipolar disorder, in partial remission, most recent episode depressed: Secondary | ICD-10-CM

## 2020-10-21 DIAGNOSIS — F4312 Post-traumatic stress disorder, chronic: Secondary | ICD-10-CM

## 2020-10-21 NOTE — Telephone Encounter (Signed)
Last filled 9/4 last seen 8/19

## 2021-02-10 ENCOUNTER — Other Ambulatory Visit: Payer: Self-pay | Admitting: Psychiatry

## 2021-02-10 DIAGNOSIS — F3175 Bipolar disorder, in partial remission, most recent episode depressed: Secondary | ICD-10-CM

## 2021-02-10 DIAGNOSIS — F4312 Post-traumatic stress disorder, chronic: Secondary | ICD-10-CM

## 2021-02-10 NOTE — Telephone Encounter (Signed)
Filled 12/16

## 2021-02-13 NOTE — Telephone Encounter (Signed)
LVM to rtc 

## 2021-02-17 NOTE — Telephone Encounter (Signed)
Still unable to reach pt

## 2021-08-20 ENCOUNTER — Encounter: Payer: Self-pay | Admitting: Psychiatry

## 2021-08-20 ENCOUNTER — Ambulatory Visit (INDEPENDENT_AMBULATORY_CARE_PROVIDER_SITE_OTHER): Payer: 59 | Admitting: Psychiatry

## 2021-08-20 VITALS — BP 123/74 | HR 80

## 2021-08-20 DIAGNOSIS — F902 Attention-deficit hyperactivity disorder, combined type: Secondary | ICD-10-CM

## 2021-08-20 DIAGNOSIS — G4700 Insomnia, unspecified: Secondary | ICD-10-CM

## 2021-08-20 DIAGNOSIS — F4312 Post-traumatic stress disorder, chronic: Secondary | ICD-10-CM

## 2021-08-20 DIAGNOSIS — F3175 Bipolar disorder, in partial remission, most recent episode depressed: Secondary | ICD-10-CM

## 2021-08-20 MED ORDER — BUPROPION HCL ER (XL) 150 MG PO TB24: 150.0000 mg | ORAL_TABLET | Freq: Every day | ORAL | 1 refills | Status: DC

## 2021-08-20 MED ORDER — ALPRAZOLAM 1 MG PO TABS: ORAL_TABLET | ORAL | 2 refills | Status: DC

## 2021-08-20 MED ORDER — AMPHETAMINE-DEXTROAMPHETAMINE 20 MG PO TABS: ORAL_TABLET | ORAL | 0 refills | Status: DC

## 2021-08-20 MED ORDER — ASENAPINE MALEATE 5 MG SL SUBL: 5.0000 mg | SUBLINGUAL_TABLET | Freq: Two times a day (BID) | SUBLINGUAL | 1 refills | Status: DC

## 2021-08-20 MED ORDER — LAMOTRIGINE 200 MG PO TABS: ORAL_TABLET | ORAL | 1 refills | Status: DC

## 2021-08-20 MED ORDER — ZOLPIDEM TARTRATE 10 MG PO TABS: ORAL_TABLET | ORAL | 0 refills | Status: DC

## 2021-08-20 MED ORDER — ZOLPIDEM TARTRATE 10 MG PO TABS: 10.0000 mg | ORAL_TABLET | Freq: Every evening | ORAL | 2 refills | Status: DC | PRN

## 2021-08-20 NOTE — Progress Notes (Signed)
Dana Rodgers [id number] Mar 17, 1970 51 y.o.  Subjective:   Patient ID:  Dana Rodgers is a 51 y.o. (DOB 1970-11-22) female.  Chief Complaint:  Chief Complaint  Patient presents with   Establish Care    Re-establish care for mood, anxiety, insomnia, and hallucinations.     HPI Dana Rodgers presents to the office today to re-establish care for treatment of anxiety, mood disturbance, insomnia, and hallucinations.  She was last seen in the office on 09/05/2020 and she saw Dr. Evelene Croon in the interim. She reports that her mood is "getting better... a lot less depressive days" and less manic s/s. Panic attacks are infrequent. Occ hallucinations, often as if she is in a crowded room with muffled voices. Occ VH of a life-threatening event. She reports she started maintaining a consistent routine  with sleep, meals, activities and this has been helpful for mood. She has been working out regularly for the last 3 months. She reports that she gained 40 lbs over the pandemic. She has intentionally lost 15 lbs. She reports that she has some anxiety with crowds. Has hypervigilance in crowds. She reports that she has intrusive thoughts of being kidnapped. Sleeping about 6-7 hours a night. Sleep has improved with exercise. Energy and motivation have improved. Concentration has been adequate. Denies SI.  Husband has been in [place] for 5 weeks and she is flying later today and this causes some anxiety.   Has been going to the synagogue every Saturday again. Has been doing some volunteer work.   Has limited interaction with her mother.   Ambien last filled 07/22/21. Adderall last filled 07/15/21 Alprazolam last 05/21/21  Past Psychiatric Medication Trials: Saphris Ambien Lamictal Wellbutrin XL Paxil Adderall Xanax  AIMS    Flowsheet Row Office Visit from 08/20/2021 in [facility] Office Visit from 09/05/2020 in [facility] Office Visit from 05/19/2020 in [facility]  AIMS Total Score 0 0 1        Review of Systems:  Review of Systems  Cardiovascular:  Negative for palpitations.  Musculoskeletal:  Negative for gait problem.  Neurological:  Negative for tremors.  Psychiatric/Behavioral:         Please refer to HPI    Medications: I have reviewed the patient's current medications.  Current Outpatient Medications  Medication Sig Dispense Refill   folic acid (FOLVITE) 800 MCG tablet Take 400 mcg by mouth daily.     VENTOLIN HFA 108 (90 Base) MCG/ACT inhaler      [START ON 09/17/2021] zolpidem (AMBIEN) 10 MG tablet Take 1 tablet (10 mg total) by mouth at bedtime as needed for sleep. 30 tablet 2   ALPRAZolam (XANAX) 1 MG tablet TAKE 1 TABLET (1 MG TOTAL) BY MOUTH 2 (TWO) TIMES DAILY AS NEEDED. * 60 tablet 2   [START ON 10/15/2021] amphetamine-dextroamphetamine (ADDERALL) 20 MG tablet As 1.5 tablet every morning and 1 tablet every noon and 1600 105 tablet 0   [START ON 09/17/2021] amphetamine-dextroamphetamine (ADDERALL) 20 MG tablet Take 1.5 tablets total 30 mg every morning and take 1 tablet every noon and 1600 105 tablet 0   amphetamine-dextroamphetamine (ADDERALL) 20 MG tablet Take 1.5 tablets total 30 mg every morning and 1 tablet every noon and 1600 105 tablet 0   asenapine (SAPHRIS) 5 MG SUBL 24 hr tablet Place 1 tablet (5 mg total) under the tongue 2 (two) times daily. 180 tablet 1   buPROPion (WELLBUTRIN XL) 150 MG 24 hr tablet Take 1 tablet (  150 mg total) by mouth daily after breakfast. 90 tablet 1   lamoTRIgine (LAMICTAL) 200 MG tablet TAKE 1 TABLET BY MOUTH EVERYDAY AT BEDTIME 90 tablet 1   zolpidem (AMBIEN) 10 MG tablet TAKE 1 TABLET BY MOUTH EVERYDAY AT BEDTIME 30 tablet 0   No current facility-administered medications for this visit.    Medication Side Effects: None  Allergies:  Allergies  Allergen Reactions   Latex Anaphylaxis   Phenobarbital Other (See Comments)    Caused seizure    Past Medical History:  Diagnosis Date    ADD (attention deficit disorder)    Bipolar disorder (HCC)    Clotting disorder (HCC)     Past Medical History, Surgical history, Social history, and Family history were reviewed and updated as appropriate.   Please see review of systems for further details on the patient's review from today.   Objective:   Physical Exam:  BP 123/74   Pulse 80   Physical Exam Constitutional:      General: She is not in acute distress. Musculoskeletal:        General: No deformity.  Neurological:     Mental Status: She is alert and oriented to person, place, and time.     Coordination: Coordination normal.  Psychiatric:        Attention and Perception: Attention normal.        Mood and Affect: Mood is not depressed. Affect is not labile, blunt, angry or inappropriate.        Speech: Speech normal.        Behavior: Behavior normal.        Thought Content: Thought content normal. Thought content is not paranoid or delusional. Thought content does not include homicidal or suicidal ideation. Thought content does not include homicidal or suicidal plan.        Cognition and Memory: Cognition and memory normal.        Judgment: Judgment normal.     Comments: Insight intact She reports occ hallucinations. She does not appear to be responding to internal stimuli at time of exam. She exhibits some anxiety when talking about flying to [facility] today     Lab Review:  No results found for: "NA", "K", "CL", "CO2", "GLUCOSE", "BUN", "CREATININE", "CALCIUM", "PROT", "ALBUMIN", "AST", "ALT", "ALKPHOS", "BILITOT", "GFRNONAA", "GFRAA"  No results found for: "WBC", "RBC", "HGB", "HCT", "PLT", "MCV", "MCH", "MCHC", "RDW", "LYMPHSABS", "MONOABS", "EOSABS", "BASOSABS"  No results found for: "POCLITH", "LITHIUM"   No results found for: "PHENYTOIN", "PHENOBARB", "VALPROATE", "CBMZ"   .res Assessment: Plan:    Patient seen for 30 minutes and time spent reviewing social and medical history since she was last seen  in the office, as well as reviewing psychiatric symptoms.  She reports that her current medications remain effective and are well-tolerated.  She reports that her mood, anxiety, and insomnia has improved since making some lifestyle changes to include exercise, maintaining a routine and consistent sleep schedule.  She denies any complaints or concerns at this time. Will continue current plan of care since target signs and symptoms are well controlled without any tolerability issues. Continue Saphris 5 mg twice daily for mood stabilization and psychosis. Continue Ambien 10 mg at bedtime as needed for insomnia.  Will spend prescription to nearby pharmacy for her to feel today to take with her to [place].  Will also send a prescription for next month to her home pharmacy for Ambien. Continue Wellbutrin XL 150 mg daily for depression. Continue Xanax 1 mg  twice daily as needed for anxiety. Pt to follow-up in 3 months or sooner if clinically indicated.  Patient advised to contact office with any questions, adverse effects, or acute worsening in signs and symptoms.   Dana Rodgers was seen today for establish care.  Diagnoses and all orders for this visit:  Bipolar I disorder, current or most recent episode depressed, in partial remission with mixed features (HCC) -     zolpidem (AMBIEN) 10 MG tablet; TAKE 1 TABLET BY MOUTH EVERYDAY AT BEDTIME -     asenapine (SAPHRIS) 5 MG SUBL 24 hr tablet; Place 1 tablet (5 mg total) under the tongue 2 (two) times daily. -     buPROPion (WELLBUTRIN XL) 150 MG 24 hr tablet; Take 1 tablet (150 mg total) by mouth daily after breakfast. -     lamoTRIgine (LAMICTAL) 200 MG tablet; TAKE 1 TABLET BY MOUTH EVERYDAY AT BEDTIME  Attention deficit hyperactivity disorder, combined type -     amphetamine-dextroamphetamine (ADDERALL) 20 MG tablet; As 1.5 tablet every morning and 1 tablet every noon and 1600 -     amphetamine-dextroamphetamine (ADDERALL) 20 MG tablet; Take 1.5 tablets  total 30 mg every morning and take 1 tablet every noon and 1600 -     amphetamine-dextroamphetamine (ADDERALL) 20 MG tablet; Take 1.5 tablets total 30 mg every morning and 1 tablet every noon and 1600 -     buPROPion (WELLBUTRIN XL) 150 MG 24 hr tablet; Take 1 tablet (150 mg total) by mouth daily after breakfast.  Chronic post-traumatic stress disorder -     zolpidem (AMBIEN) 10 MG tablet; TAKE 1 TABLET BY MOUTH EVERYDAY AT BEDTIME -     ALPRAZolam (XANAX) 1 MG tablet; TAKE 1 TABLET (1 MG TOTAL) BY MOUTH 2 (TWO) TIMES DAILY AS NEEDED. * -     lamoTRIgine (LAMICTAL) 200 MG tablet; TAKE 1 TABLET BY MOUTH EVERYDAY AT BEDTIME  Insomnia, unspecified type -     zolpidem (AMBIEN) 10 MG tablet; Take 1 tablet (10 mg total) by mouth at bedtime as needed for sleep.     Please see After Visit Summary for patient specific instructions.  Future Appointments  Date Time Provider Department Center  11/20/2021  9:30 AM Corie Chiquito, PMHNP [facility] None    No orders of the defined types were placed in this encounter.   -------------------------------

## 2021-11-20 ENCOUNTER — Ambulatory Visit: Payer: PRIVATE HEALTH INSURANCE | Admitting: Psychiatry

## 2021-11-23 ENCOUNTER — Ambulatory Visit (INDEPENDENT_AMBULATORY_CARE_PROVIDER_SITE_OTHER): Payer: 59 | Admitting: Psychiatry

## 2021-11-23 ENCOUNTER — Encounter: Payer: Self-pay | Admitting: Psychiatry

## 2021-11-23 DIAGNOSIS — F4312 Post-traumatic stress disorder, chronic: Secondary | ICD-10-CM

## 2021-11-23 DIAGNOSIS — F3175 Bipolar disorder, in partial remission, most recent episode depressed: Secondary | ICD-10-CM

## 2021-11-23 DIAGNOSIS — G4700 Insomnia, unspecified: Secondary | ICD-10-CM

## 2021-11-23 DIAGNOSIS — F902 Attention-deficit hyperactivity disorder, combined type: Secondary | ICD-10-CM

## 2021-11-23 MED ORDER — ALPRAZOLAM 1 MG PO TABS
ORAL_TABLET | ORAL | 2 refills | Status: DC
Start: 2021-12-21 — End: 2022-02-25

## 2021-11-23 MED ORDER — AMPHETAMINE-DEXTROAMPHETAMINE 20 MG PO TABS: ORAL_TABLET | ORAL | 0 refills | Status: DC

## 2021-11-23 MED ORDER — LAMOTRIGINE 200 MG PO TABS: ORAL_TABLET | ORAL | 1 refills | Status: DC

## 2021-11-23 MED ORDER — BUPROPION HCL ER (XL) 150 MG PO TB24: 150.0000 mg | ORAL_TABLET | Freq: Every day | ORAL | 1 refills | Status: DC

## 2021-11-23 MED ORDER — ASENAPINE MALEATE 5 MG SL SUBL: 5.0000 mg | SUBLINGUAL_TABLET | Freq: Two times a day (BID) | SUBLINGUAL | 1 refills | Status: DC

## 2021-11-23 MED ORDER — ZOLPIDEM TARTRATE 10 MG PO TABS: 10.0000 mg | ORAL_TABLET | Freq: Every evening | ORAL | 2 refills | Status: DC | PRN

## 2021-11-23 NOTE — Progress Notes (Unsigned)
[name] [id number] 05/20/1970 51 y.o.  Subjective:   Patient ID:  Dana Rodgers is a 51 y.o. (DOB 05-31-1970) female.  Chief Complaint: No chief complaint on file.   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 She reports that she and her husband decided that she should limit time spent watching news and on social media. She reports that her mood has been sad in response to events in [place]. Denies depression. 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 She has some panic and anxiety after receiving certain news. She has felt the start of panic and has had to use her Xanax prn on occasion.   She reports that she has had some nights of disrupted sleep since events in [place]. She reports that she has had disrupted sleep the last 2 nights. She will then nap to help improve her sleep.   She notices "some" manic s/s. She reports that she had some over-spending and caught this on the 3rd or 4th day. She reports that they have safe guards to prevent and monitor her spending. She reports that spending has resolved. She has been going to the gym. She reports that her energy and motivation have been ok. She reports that she has been eating more and describes emotional eating.She has decided to limit sugar intake. Concentration has been "ok." She reports that she has had "some" hallucinations with decreased sleep and/or anxiety. She denies any recent severely distressing hallucinations. Hallucinations are "noise" in the background. Some mild paranoia. Denies SI.   Her synagogue received a bomb threat. 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 She reports that "this is the first year I have taught in awhile" due to her  mental health.   She reports that she has a good support system.   Younger son will be moving back to this area.   Adderall last filled 10/21/21 Alprazolam last filled 10/19/21 x2 Ambien 10/19/21 x 3  Past Psychiatric Medication Trials: Saphris Ambien Lamictal Wellbutrin XL Paxil Adderall Xanax  AIMS    Flowsheet Row Office Visit from 08/20/2021 in [facility] Office Visit from 09/05/2020 in [facility] Office Visit from 05/19/2020 in [facility]  AIMS Total Score 0 0 1        Review of Systems:  Review of Systems  Cardiovascular:  Negative for palpitations.  Musculoskeletal:  Negative for gait problem.  Neurological:  Negative for tremors.  Psychiatric/Behavioral:         Please refer to HPI    Medications: I have reviewed the patient's current medications.  Current Outpatient Medications  Medication Sig Dispense Refill   ALPRAZolam (XANAX) 1 MG tablet TAKE 1 TABLET (1 MG TOTAL) BY MOUTH 2 (TWO) TIMES DAILY AS NEEDED. * 60 tablet 2   amphetamine-dextroamphetamine (ADDERALL) 20 MG tablet As 1.5 tablet every morning and 1 tablet every noon and 1600 105 tablet 0   amphetamine-dextroamphetamine (ADDERALL) 20 MG tablet Take 1.5 tablets total 30 mg every morning and take 1 tablet every noon and 1600 105 tablet 0   amphetamine-dextroamphetamine (ADDERALL) 20 MG tablet Take 1.5 tablets total 30 mg every morning and 1 tablet every noon and 1600 105 tablet 0  asenapine (SAPHRIS) 5 MG SUBL 24 hr tablet Place 1 tablet (5 mg total) under the tongue 2 (two) times daily. 180 tablet 1   buPROPion (WELLBUTRIN XL) 150 MG 24 hr tablet Take 1 tablet (150 mg total) by mouth daily after breakfast. 90 tablet 1   folic acid (FOLVITE) 469 MCG tablet Take 400 mcg by mouth daily.     lamoTRIgine (LAMICTAL) 200 MG tablet TAKE 1 TABLET BY MOUTH EVERYDAY AT BEDTIME 90 tablet 1   VENTOLIN HFA 108 (90 Base) MCG/ACT inhaler      zolpidem (AMBIEN) 10 MG  tablet TAKE 1 TABLET BY MOUTH EVERYDAY AT BEDTIME 30 tablet 0   zolpidem (AMBIEN) 10 MG tablet Take 1 tablet (10 mg total) by mouth at bedtime as needed for sleep. 30 tablet 2   No current facility-administered medications for this visit.    Medication Side Effects: None  Allergies:  Allergies  Allergen Reactions   Latex Anaphylaxis   Phenobarbital Other (See Comments)    Caused seizure    Past Medical History:  Diagnosis Date   ADD (attention deficit disorder)    Bipolar disorder (HCC)    Clotting disorder ([place])     Past Medical History, Surgical history, Social history, and Family history were reviewed and updated as appropriate.   Please see review of systems for further details on the patient's review from today.   Objective:   Physical Exam:  There were no vitals taken for this visit.  Physical Exam  Lab Review:  No results found for: "NA", "K", "CL", "CO2", "GLUCOSE", "BUN", "CREATININE", "CALCIUM", "PROT", "ALBUMIN", "AST", "ALT", "ALKPHOS", "BILITOT", "GFRNONAA", "GFRAA"  No results found for: "WBC", "RBC", "HGB", "HCT", "PLT", "MCV", "MCH", "MCHC", "RDW", "LYMPHSABS", "MONOABS", "EOSABS", "BASOSABS"  No results found for: "POCLITH", "LITHIUM"   No results found for: "PHENYTOIN", "PHENOBARB", "VALPROATE", "CBMZ"   .res Assessment: Plan:    There are no diagnoses linked to this encounter.   Please see After Visit Summary for patient specific instructions.  No future appointments.  No orders of the defined types were placed in this encounter.   -------------------------------

## 2022-02-08 LAB — COMPREHENSIVE METABOLIC PANEL
Albumin: 4.5 (ref 3.5–5.0)
Calcium: 9.8 (ref 8.7–10.7)
Globulin: 2.8
eGFR: 71

## 2022-02-08 LAB — LIPID PANEL
Cholesterol: 199 (ref 0–200)
HDL: 66 (ref 35–70)
LDL Cholesterol: 120
Triglycerides: 73 (ref 40–160)

## 2022-02-08 LAB — BASIC METABOLIC PANEL
BUN: 13 (ref 4–21)
CO2: 24 — AB (ref 13–22)
Chloride: 104 (ref 99–108)
Creatinine: 1 (ref 0.5–1.1)
Glucose: 82
Potassium: 4.4 mEq/L (ref 3.5–5.1)
Sodium: 141 (ref 137–147)

## 2022-02-08 LAB — HEMOGLOBIN A1C: Hemoglobin A1C: 5.3

## 2022-02-08 LAB — HEPATIC FUNCTION PANEL
ALT: 18 U/L (ref 7–35)
AST: 21 (ref 13–35)
Alkaline Phosphatase: 100 (ref 25–125)
Bilirubin, Total: 0.8

## 2022-02-08 LAB — CBC AND DIFFERENTIAL
HCT: 40 (ref 36–46)
Hemoglobin: 13.6 (ref 12.0–16.0)
Neutrophils Absolute: 4.6
Platelets: 293 10*3/uL (ref 150–400)
WBC: 8.9

## 2022-02-08 LAB — PROTIME-INR: Protime: 10 (ref 10.0–13.8)

## 2022-02-08 LAB — TSH: TSH: 1.57 (ref 0.41–5.90)

## 2022-02-08 LAB — POCT INR: INR: 0.9 (ref 0.80–1.20)

## 2022-02-08 LAB — VITAMIN D 25 HYDROXY (VIT D DEFICIENCY, FRACTURES): Vit D, 25-Hydroxy: 15.8

## 2022-02-08 LAB — CBC: RBC: 4.35 (ref 3.87–5.11)

## 2022-02-08 LAB — VITAMIN B12: Vitamin B-12: 414

## 2022-02-19 ENCOUNTER — Encounter: Payer: Self-pay | Admitting: Family

## 2022-02-19 DIAGNOSIS — E039 Hypothyroidism, unspecified: Secondary | ICD-10-CM | POA: Insufficient documentation

## 2022-02-19 DIAGNOSIS — Z9049 Acquired absence of other specified parts of digestive tract: Secondary | ICD-10-CM | POA: Insufficient documentation

## 2022-02-19 DIAGNOSIS — D682 Hereditary deficiency of other clotting factors: Secondary | ICD-10-CM | POA: Insufficient documentation

## 2022-02-19 DIAGNOSIS — R569 Unspecified convulsions: Secondary | ICD-10-CM | POA: Insufficient documentation

## 2022-02-19 DIAGNOSIS — G4700 Insomnia, unspecified: Secondary | ICD-10-CM | POA: Insufficient documentation

## 2022-02-19 DIAGNOSIS — K754 Autoimmune hepatitis: Secondary | ICD-10-CM | POA: Insufficient documentation

## 2022-02-19 DIAGNOSIS — J45909 Unspecified asthma, uncomplicated: Secondary | ICD-10-CM | POA: Insufficient documentation

## 2022-02-19 DIAGNOSIS — I8290 Acute embolism and thrombosis of unspecified vein: Secondary | ICD-10-CM | POA: Insufficient documentation

## 2022-02-19 DIAGNOSIS — Z9889 Other specified postprocedural states: Secondary | ICD-10-CM | POA: Insufficient documentation

## 2022-02-19 DIAGNOSIS — F410 Panic disorder [episodic paroxysmal anxiety] without agoraphobia: Secondary | ICD-10-CM | POA: Insufficient documentation

## 2022-02-19 DIAGNOSIS — M5090 Cervical disc disorder, unspecified, unspecified cervical region: Secondary | ICD-10-CM | POA: Insufficient documentation

## 2022-02-19 HISTORY — DX: Acquired absence of other specified parts of digestive tract: Z90.49

## 2022-02-22 ENCOUNTER — Ambulatory Visit (INDEPENDENT_AMBULATORY_CARE_PROVIDER_SITE_OTHER): Payer: PRIVATE HEALTH INSURANCE | Admitting: Family

## 2022-02-22 ENCOUNTER — Encounter: Payer: Self-pay | Admitting: Family

## 2022-02-22 VITALS — BP 124/70 | HR 79 | Temp 97.9°F | Ht 60.0 in | Wt 301.2 lb

## 2022-02-22 DIAGNOSIS — F22 Delusional disorders: Secondary | ICD-10-CM

## 2022-02-22 DIAGNOSIS — Z6841 Body Mass Index (BMI) 40.0 and over, adult: Secondary | ICD-10-CM

## 2022-02-22 DIAGNOSIS — D682 Hereditary deficiency of other clotting factors: Secondary | ICD-10-CM

## 2022-02-22 DIAGNOSIS — D6851 Activated protein C resistance: Secondary | ICD-10-CM

## 2022-02-22 DIAGNOSIS — F3175 Bipolar disorder, in partial remission, most recent episode depressed: Secondary | ICD-10-CM

## 2022-02-22 DIAGNOSIS — Z975 Presence of (intrauterine) contraceptive device: Secondary | ICD-10-CM

## 2022-02-22 DIAGNOSIS — Z1231 Encounter for screening mammogram for malignant neoplasm of breast: Secondary | ICD-10-CM

## 2022-02-22 DIAGNOSIS — K754 Autoimmune hepatitis: Secondary | ICD-10-CM

## 2022-02-22 DIAGNOSIS — E559 Vitamin D deficiency, unspecified: Secondary | ICD-10-CM | POA: Diagnosis not present

## 2022-02-22 DIAGNOSIS — Z1211 Encounter for screening for malignant neoplasm of colon: Secondary | ICD-10-CM

## 2022-02-22 DIAGNOSIS — R569 Unspecified convulsions: Secondary | ICD-10-CM

## 2022-02-22 MED ORDER — VITAMIN D (ERGOCALCIFEROL) 1.25 MG (50000 UNIT) PO CAPS: 50000.0000 [IU] | ORAL_CAPSULE | ORAL | 3 refills | Status: DC

## 2022-02-22 MED ORDER — WEGOVY 0.25 MG/0.5ML ~~LOC~~ SOAJ: 0.2500 mg | SUBCUTANEOUS | 0 refills | Status: DC

## 2022-02-22 NOTE — Patient Instructions (Addendum)
Sending referral to OB/GYN Will send in RX for [facility].   Continue current other meds. POC reviewed and agreed to.   Follow up in 1 month.

## 2022-02-22 NOTE — Progress Notes (Addendum)
Subjective:    Patient ID: Dana Rodgers, female    DOB: 08/25/1970, 52 y.o.   MRN: [id number]  Pt. Here today for 2 week n/p follow up.   Had labs done at that time, so we will review in detail today.   Labs: Mostly normal, Vitamin D was low.  INR was WNL, so I do not think that anticoagulation is necessary at this time.   No other concerns today.       Review of Systems  Constitutional:  Positive for fatigue and unexpected weight change.  All other systems reviewed and are negative.  Past Medical History:  Diagnosis Date   ADD (attention deficit disorder)    Bipolar disorder ([place])    Clotting disorder ([place])    History of appendectomy 02/19/2022   History of cholecystectomy 02/19/2022   Past Surgical History:  Procedure Laterality Date   APPENDECTOMY     CHOLECYSTECTOMY     Social History   Socioeconomic History   Marital status: Married    Spouse name: Not on file   Number of children: Not on file   Years of education: Not on file   Highest education level: Not on file  Occupational History   Not on file  Tobacco Use   Smoking status: Never   Smokeless tobacco: Never  Vaping Use   Vaping Use: Never used  Substance and Sexual Activity   Alcohol use: No   Drug use: No   Sexual activity: Not on file    Comment: married, not working  Other Topics Concern   Not on file  Social History Narrative   Not on file   Social Determinants of Health   Financial Resource Strain: Not on file  Food Insecurity: Not on file  Transportation Needs: Not on file  Physical Activity: Not on file  Stress: Not on file  Social Connections: Not on file  Intimate Partner Violence: Not on file    Family History  Problem Relation Age of Onset   Osteoporosis Mother    Clotting disorder Sister        DVT post op   Bipolar disorder Sister    Lupus Sister    Osteoporosis Maternal Grandmother    Dementia Maternal Grandmother    Cancer Maternal Grandfather        Throat  Cancer   Cancer Paternal Grandmother        breast ca   Clotting disorder Paternal Grandfather        DVT        Objective:   Physical Exam Vitals and nursing note reviewed.  Constitutional:      General: She is not in acute distress.    Appearance: Normal appearance. She is obese.  HENT:     Head: Normocephalic.  Eyes:     Pupils: Pupils are equal, round, and reactive to light.  Cardiovascular:     Rate and Rhythm: Normal rate.  Pulmonary:     Effort: Pulmonary effort is normal.  Neurological:     Mental Status: She is alert.    Recent Results (from the past 2160 hour(s))  POCT INR     Status: None   Collection Time: 02/08/22  2:11 PM  Result Value Ref Range   INR 0.90 0.80 - 1.20  CBC and differential     Status: None   Collection Time: 02/08/22  2:11 PM  Result Value Ref Range   Hemoglobin 13.6 12.0 - 16.0   HCT 40  36 - 46   Neutrophils Absolute 4.60    Platelets 293 150 - 400 K/uL   WBC 8.9   CBC     Status: None   Collection Time: 02/08/22  2:11 PM  Result Value Ref Range   RBC 4.35 3.87 - 5.11  Protime-INR     Status: None   Collection Time: 02/08/22  2:11 PM  Result Value Ref Range   Protime 10.0 10.0 - 13.8  VITAMIN D 25 Hydroxy (Vit-D Deficiency, Fractures)     Status: None   Collection Time: 02/08/22  2:11 PM  Result Value Ref Range   Vit D, 25-Hydroxy 72.5   Basic metabolic panel     Status: Abnormal   Collection Time: 02/08/22  2:11 PM  Result Value Ref Range   Glucose 82    BUN 13 4 - 21   CO2 24 (A) 13 - 22   Creatinine 1.0 0.5 - 1.1   Potassium 4.4 3.5 - 5.1 mEq/L   Sodium 141 137 - 147   Chloride 104 99 - 108  Comprehensive metabolic panel     Status: None   Collection Time: 02/08/22  2:11 PM  Result Value Ref Range   Globulin 2.8    eGFR 71    Calcium 9.8 8.7 - 10.7   Albumin 4.5 3.5 - 5.0  Lipid panel     Status: None   Collection Time: 02/08/22  2:11 PM  Result Value Ref Range   Triglycerides 73 40 - 160   Cholesterol 199 0 -  200   HDL 66 35 - 70   LDL Cholesterol 120   Hepatic function panel     Status: None   Collection Time: 02/08/22  2:11 PM  Result Value Ref Range   Alkaline Phosphatase 100 25 - 125   ALT 18 7 - 35 U/L   AST 21 13 - 35   Bilirubin, Total 0.8   Vitamin B12     Status: None   Collection Time: 02/08/22  2:11 PM  Result Value Ref Range   Vitamin B-12 414   Hemoglobin A1c     Status: None   Collection Time: 02/08/22  2:11 PM  Result Value Ref Range   Hemoglobin A1C 5.3   TSH     Status: None   Collection Time: 02/08/22  2:11 PM  Result Value Ref Range   TSH 1.57 0.41 - 5.90          Assessment & Plan:   Dana Rodgers was seen today for follow-up.  Diagnoses and all orders for this visit:  Vitamin D deficiency, unspecified -     Vitamin D, Ergocalciferol, (DRISDOL) 1.25 MG (50000 UNIT) CAPS capsule; Take 1 capsule (50,000 Units total) by mouth every 7 (seven) days.  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 (Patient not taking: Reported on  02/25/2022)   Patient Instructions  Sending referral to OB/GYN Will send in RX for [facility].   Continue current other meds. POC reviewed and agreed to.   Follow up in 1 month.  Total Time Spent: 30 minutes  [place], FNP

## 2022-02-24 ENCOUNTER — Encounter: Payer: Self-pay | Admitting: Family

## 2022-02-25 ENCOUNTER — Encounter: Payer: Self-pay | Admitting: Psychiatry

## 2022-02-25 ENCOUNTER — Telehealth (INDEPENDENT_AMBULATORY_CARE_PROVIDER_SITE_OTHER): Payer: 59 | Admitting: Psychiatry

## 2022-02-25 DIAGNOSIS — F4312 Post-traumatic stress disorder, chronic: Secondary | ICD-10-CM | POA: Diagnosis not present

## 2022-02-25 DIAGNOSIS — F902 Attention-deficit hyperactivity disorder, combined type: Secondary | ICD-10-CM | POA: Diagnosis not present

## 2022-02-25 DIAGNOSIS — F3175 Bipolar disorder, in partial remission, most recent episode depressed: Secondary | ICD-10-CM | POA: Diagnosis not present

## 2022-02-25 DIAGNOSIS — G4700 Insomnia, unspecified: Secondary | ICD-10-CM

## 2022-02-25 NOTE — Addendum Note (Signed)
Addended by: Georgian Co on: 02/25/2022 09:32 PM   Modules accepted: Orders
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
## 2022-02-26 MED ORDER — LAMOTRIGINE 200 MG PO TABS: ORAL_TABLET | ORAL | 1 refills | Status: DC

## 2022-02-26 MED ORDER — ZOLPIDEM TARTRATE 10 MG PO TABS: 10.0000 mg | ORAL_TABLET | Freq: Every evening | ORAL | 5 refills | Status: DC | PRN

## 2022-02-26 MED ORDER — ALPRAZOLAM 1 MG PO TABS: ORAL_TABLET | ORAL | 5 refills | Status: DC

## 2022-02-26 MED ORDER — AMPHETAMINE-DEXTROAMPHETAMINE 20 MG PO TABS: ORAL_TABLET | ORAL | 0 refills | Status: DC

## 2022-02-26 MED ORDER — ASENAPINE MALEATE 5 MG SL SUBL: 5.0000 mg | SUBLINGUAL_TABLET | Freq: Two times a day (BID) | SUBLINGUAL | 1 refills | Status: DC

## 2022-02-26 MED ORDER — BUPROPION HCL ER (XL) 150 MG PO TB24: 150.0000 mg | ORAL_TABLET | Freq: Every day | ORAL | 1 refills | Status: DC

## 2022-03-08 MED ORDER — SEMAGLUTIDE-WEIGHT MANAGEMENT 0.5 MG/0.5ML ~~LOC~~ SOAJ: 0.5000 mg | SUBCUTANEOUS | 0 refills | Status: DC

## 2022-03-08 MED ORDER — SEMAGLUTIDE-WEIGHT MANAGEMENT 2.4 MG/0.75ML ~~LOC~~ SOAJ: 2.4000 mg | SUBCUTANEOUS | 0 refills | Status: DC

## 2022-03-08 MED ORDER — SEMAGLUTIDE-WEIGHT MANAGEMENT 0.25 MG/0.5ML ~~LOC~~ SOAJ: 0.2500 mg | SUBCUTANEOUS | 0 refills | Status: DC

## 2022-03-08 MED ORDER — SEMAGLUTIDE-WEIGHT MANAGEMENT 1.7 MG/0.75ML ~~LOC~~ SOAJ: 1.7000 mg | SUBCUTANEOUS | 0 refills | Status: DC

## 2022-03-08 MED ORDER — SEMAGLUTIDE-WEIGHT MANAGEMENT 1 MG/0.5ML ~~LOC~~ SOAJ: 1.0000 mg | SUBCUTANEOUS | 0 refills | Status: DC

## 2022-03-19 ENCOUNTER — Encounter: Payer: PRIVATE HEALTH INSURANCE | Admitting: Obstetrics

## 2022-03-23 ENCOUNTER — Encounter: Payer: Self-pay | Admitting: Family

## 2022-03-23 ENCOUNTER — Ambulatory Visit (INDEPENDENT_AMBULATORY_CARE_PROVIDER_SITE_OTHER): Payer: PRIVATE HEALTH INSURANCE | Admitting: Family

## 2022-03-23 VITALS — BP 130/78 | HR 78 | Ht 60.0 in | Wt 297.0 lb

## 2022-03-23 DIAGNOSIS — K754 Autoimmune hepatitis: Secondary | ICD-10-CM | POA: Diagnosis not present

## 2022-03-23 DIAGNOSIS — Z6841 Body Mass Index (BMI) 40.0 and over, adult: Secondary | ICD-10-CM | POA: Diagnosis not present

## 2022-03-23 DIAGNOSIS — D682 Hereditary deficiency of other clotting factors: Secondary | ICD-10-CM

## 2022-03-23 NOTE — Progress Notes (Signed)
Established Patient Office Visit  Subjective:  Patient ID: Dana Rodgers, female    DOB: 1970/08/08  Age: 52 y.o. MRN: [id number]  Chief Complaint  Patient presents with   Follow-up    1 month follow up    Pt here for her 1 month follow up.  She did finally get her [facility] and says that she has been tolerating it well.   No other concerns today.      Past Medical History:  Diagnosis Date   ADD (attention deficit disorder)    Bipolar disorder ([place])    Clotting disorder ([place])    History of appendectomy 02/19/2022   History of cholecystectomy 02/19/2022    Past Surgical History:  Procedure Laterality Date   APPENDECTOMY     CHOLECYSTECTOMY      Social History   Socioeconomic History   Marital status: Married    Spouse name: Not on file   Number of children: Not on file   Years of education: Not on file   Highest education level: Not on file  Occupational History   Occupation: works at [employer]  Tobacco Use   Smoking status: Never   Smokeless tobacco: Never  Vaping Use   Vaping Use: Never used  Substance and Sexual Activity   Alcohol use: No   Drug use: No   Sexual activity: Not on file    Comment: married  Other Topics Concern   Not on file  Social History Narrative   Not on file   Social Determinants of Health   Financial Resource Strain: Not on file  Food Insecurity: Not on file  Transportation Needs: Not on file  Physical Activity: Not on file  Stress: Not on file  Social Connections: Not on file  Intimate Partner Violence: Not on file    Family History  Problem Relation Age of Onset   Osteoporosis Mother    Clotting disorder Sister        DVT post op   Bipolar disorder Sister    Lupus Sister    Osteoporosis Maternal Grandmother    Dementia Maternal Grandmother    Cancer Maternal Grandfather        Throat Cancer   Cancer Paternal Grandmother        breast ca   Clotting disorder Paternal Grandfather        DVT    Allergies   Allergen Reactions   Latex Anaphylaxis   Phenobarbital Other (See Comments)    Caused seizure  Other Reaction(s): seizure    ROS     Objective:   BP 130/78   Pulse 78   Ht 5' (1.524 m)   Wt 297 lb (134.7 kg)   SpO2 96%   BMI 58.00 kg/m   Vitals:   03/23/22 0915  BP: 130/78  Pulse: 78  Height: 5' (1.524 m)  Weight: 297 lb (134.7 kg)  SpO2: 96%  BMI (Calculated): 58    Physical Exam   No results found for any visits on 03/23/22.  Recent Results (from the past 2160 hour(s))  POCT INR     Status: None   Collection Time: 02/08/22  2:11 PM  Result Value Ref Range   INR 0.90 0.80 - 1.20  CBC and differential     Status: None   Collection Time: 02/08/22  2:11 PM  Result Value Ref Range   Hemoglobin 13.6 12.0 - 16.0   HCT 40 36 - 46   Neutrophils Absolute 4.60  Platelets 293 150 - 400 K/uL   WBC 8.9   CBC     Status: None   Collection Time: 02/08/22  2:11 PM  Result Value Ref Range   RBC 4.35 3.87 - 5.11  Protime-INR     Status: None   Collection Time: 02/08/22  2:11 PM  Result Value Ref Range   Protime 10.0 10.0 - 13.8  VITAMIN D 25 Hydroxy (Vit-D Deficiency, Fractures)     Status: None   Collection Time: 02/08/22  2:11 PM  Result Value Ref Range   Vit D, 25-Hydroxy Q000111Q   Basic metabolic panel     Status: Abnormal   Collection Time: 02/08/22  2:11 PM  Result Value Ref Range   Glucose 82    BUN 13 4 - 21   CO2 24 (A) 13 - 22   Creatinine 1.0 0.5 - 1.1   Potassium 4.4 3.5 - 5.1 mEq/L   Sodium 141 137 - 147   Chloride 104 99 - 108  Comprehensive metabolic panel     Status: None   Collection Time: 02/08/22  2:11 PM  Result Value Ref Range   Globulin 2.8    eGFR 71    Calcium 9.8 8.7 - 10.7   Albumin 4.5 3.5 - 5.0  Lipid panel     Status: None   Collection Time: 02/08/22  2:11 PM  Result Value Ref Range   Triglycerides 73 40 - 160   Cholesterol 199 0 - 200   HDL 66 35 - 70   LDL Cholesterol 120   Hepatic function panel     Status: None    Collection Time: 02/08/22  2:11 PM  Result Value Ref Range   Alkaline Phosphatase 100 25 - 125   ALT 18 7 - 35 U/L   AST 21 13 - 35   Bilirubin, Total 0.8   Vitamin B12     Status: None   Collection Time: 02/08/22  2:11 PM  Result Value Ref Range   Vitamin B-12 414   Hemoglobin A1c     Status: None   Collection Time: 02/08/22  2:11 PM  Result Value Ref Range   Hemoglobin A1C 5.3   TSH     Status: None   Collection Time: 02/08/22  2:11 PM  Result Value Ref Range   TSH 1.57 0.41 - 5.90      Assessment & Plan:   Problem List Items Addressed This Visit     Autoimmune hepatitis ([place])    Liver enzymes WNL at lab draw.   Will continue to monitor.        Factor V deficiency (HCC)    PT/INR were WNL, will manage with lab checks at follow up appointments.       Body mass index (BMI) of 50-59.9 in adult [facility]) - Primary    Patient tolerating Wegovy well.  Have already sent additional doses to pharmacy for her for the next several months.   Will follow up in two more months to make sure she is doing well.      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   Total time spent: 20 minutes  Mechele Claude, FNP  03/23/2022

## 2022-03-23 NOTE — Assessment & Plan Note (Signed)
Liver enzymes WNL at lab draw.   Will continue to monitor.

## 2022-03-23 NOTE — Assessment & Plan Note (Signed)
Patient tolerating Wegovy well.  Have already sent additional doses to pharmacy for her for the next several months.   Will follow up in two more months to make sure she is doing well.

## 2022-03-23 NOTE — Assessment & Plan Note (Signed)
PT/INR were WNL, will manage with lab checks at follow up appointments.

## 2022-03-25 ENCOUNTER — Ambulatory Visit (INDEPENDENT_AMBULATORY_CARE_PROVIDER_SITE_OTHER): Payer: PRIVATE HEALTH INSURANCE | Admitting: Certified Nurse Midwife

## 2022-03-25 ENCOUNTER — Encounter: Payer: Self-pay | Admitting: Certified Nurse Midwife

## 2022-03-25 VITALS — BP 125/89 | HR 64 | Wt 297.2 lb

## 2022-03-25 DIAGNOSIS — Z30433 Encounter for removal and reinsertion of intrauterine contraceptive device: Secondary | ICD-10-CM

## 2022-03-25 DIAGNOSIS — Z3202 Encounter for pregnancy test, result negative: Secondary | ICD-10-CM

## 2022-03-25 LAB — POCT URINE PREGNANCY: Preg Test, Ur: NEGATIVE

## 2022-03-25 MED ORDER — LEVONORGESTREL 20 MCG/DAY IU IUD
1.0000 | INTRAUTERINE_SYSTEM | Freq: Once | INTRAUTERINE | Status: AC
  Administered 2022-03-25: 1 via INTRAUTERINE

## 2022-03-25 NOTE — Patient Instructions (Signed)

## 2022-03-25 NOTE — Progress Notes (Signed)
    GYNECOLOGY OFFICE PROCEDURE NOTE  Dana Rodgers is a 53 y.o. G9P0 here for mirena IUD removal and reinsertion. No GYN concerns.    IUD Removal and Reinsertion  Patient identified, informed consent performed, consent signed.   Discussed risks of irregular bleeding, cramping, infection, malpositioning or misplacement of the IUD outside the uterus which may require further procedures. Also discussed >99% contraception efficacy, increased risk of ectopic pregnancy with failure of method.   Emphasized that this did not protect against STIs, condoms recommended during all sexual encounters.Advised to use backup contraception for one week as the risk of pregnancy is higher during the transition period of removing an IUD and replacing it with another one. Time out was performed. Speculum placed in the vagina. The strings of the IUD were grasped and pulled using ring forceps. The IUD was successfully removed in its entirety. The cervix was cleaned with Betadine x 2 and grasped anteriorly with a single tooth tenaculum.  The new Mirean IUD insertion apparatus was used to sound the uterus to 8 cm;  the IUD was then placed per manufacturer's recommendations. Strings trimmed to 3 cm. Tenaculum was removed, good hemostasis noted. Patient tolerated procedure well.   Patient was given post-procedure instructions.  She was reminded to have backup contraception for one week during this transition period between IUDs.  Patient was also asked to check IUD strings periodically and follow up in 4 weeks for IUD check.  Philip Aspen, CNM

## 2022-03-25 NOTE — Addendum Note (Signed)
Addended by: Minette Headland on: 03/25/2022 03:04 PM   Modules accepted: Orders

## 2022-03-26 ENCOUNTER — Encounter: Payer: Self-pay | Admitting: Certified Nurse Midwife

## 2022-04-28 ENCOUNTER — Ambulatory Visit: Payer: PRIVATE HEALTH INSURANCE | Admitting: Certified Nurse Midwife

## 2022-05-24 ENCOUNTER — Encounter: Payer: Self-pay | Admitting: Family

## 2022-05-24 ENCOUNTER — Ambulatory Visit (INDEPENDENT_AMBULATORY_CARE_PROVIDER_SITE_OTHER): Payer: PRIVATE HEALTH INSURANCE | Admitting: Family

## 2022-05-24 DIAGNOSIS — F3175 Bipolar disorder, in partial remission, most recent episode depressed: Secondary | ICD-10-CM | POA: Diagnosis not present

## 2022-05-24 DIAGNOSIS — K754 Autoimmune hepatitis: Secondary | ICD-10-CM

## 2022-05-24 DIAGNOSIS — Z6841 Body Mass Index (BMI) 40.0 and over, adult: Secondary | ICD-10-CM | POA: Diagnosis not present

## 2022-05-24 DIAGNOSIS — D682 Hereditary deficiency of other clotting factors: Secondary | ICD-10-CM | POA: Diagnosis not present

## 2022-05-24 DIAGNOSIS — F22 Delusional disorders: Secondary | ICD-10-CM

## 2022-05-24 MED ORDER — FEXOFENADINE HCL 180 MG PO TABS: 180.0000 mg | ORAL_TABLET | Freq: Every day | ORAL | 1 refills | Status: DC

## 2022-05-24 MED ORDER — AZELASTINE HCL 0.1 % NA SOLN: 1.0000 | Freq: Two times a day (BID) | NASAL | 12 refills | Status: AC

## 2022-05-24 NOTE — Assessment & Plan Note (Signed)
Patient is seen by psychiatry and medications are managed there.  Will defer to them for treatment.   Will reassess at follow up.

## 2022-05-24 NOTE — Assessment & Plan Note (Signed)
Continue current meds.  Will adjust as needed based on results.  The patient is asked to make an attempt to improve diet and exercise patterns to aid in medical management of this problem. Addressed importance of increasing and maintaining water intake.   

## 2022-05-24 NOTE — Progress Notes (Signed)
Established Patient Office Visit  Subjective:  Patient ID: Dana Rodgers, female    DOB: 1970/02/13  Age: 52 y.o. MRN: [id number]  Chief Complaint  Patient presents with   Follow-up    2 month follow up    Patient is here for her 2 month follow up.  She has been doing well overall, she has not had any major side effects from the wegovy  She is still driving to [place] to pick this up, but she is doing well and says that she would rather keep sending it there.  She is having significant nasal congestion, and says that she has had allergies for a very long time.   Is doing well otherwise.   No other concerns at this time.   Past Medical History:  Diagnosis Date   ADD (attention deficit disorder)    Bipolar disorder (HCC)    Clotting disorder (HCC)    History of appendectomy 02/19/2022   History of cholecystectomy 02/19/2022    Past Surgical History:  Procedure Laterality Date   APPENDECTOMY     CHOLECYSTECTOMY      Social History   Socioeconomic History   Marital status: Married    Spouse name: Not on file   Number of children: Not on file   Years of education: Not on file   Highest education level: Not on file  Occupational History   Occupation: works at [employer]  Tobacco Use   Smoking status: Never   Smokeless tobacco: Never  Vaping Use   Vaping Use: Never used  Substance and Sexual Activity   Alcohol use: No   Drug use: No   Sexual activity: Not on file    Comment: married  Other Topics Concern   Not on file  Social History Narrative   Not on file   Social Determinants of Health   Financial Resource Strain: Not on file  Food Insecurity: Not on file  Transportation Needs: Not on file  Physical Activity: Not on file  Stress: Not on file  Social Connections: Not on file  Intimate Partner Violence: Not on file    Family History  Problem Relation Age of Onset   Osteoporosis Mother    Clotting disorder Sister        DVT post op    Bipolar disorder Sister    Lupus Sister    Osteoporosis Maternal Grandmother    Dementia Maternal Grandmother    Cancer Maternal Grandfather        Throat Cancer   Cancer Paternal Grandmother        breast ca   Clotting disorder Paternal Grandfather        DVT    Allergies  Allergen Reactions   Latex Anaphylaxis   Phenobarbital Other (See Comments)    Caused seizure  Other Reaction(s): seizure    Review of Systems  HENT:  Positive for congestion and sinus pain.   All other systems reviewed and are negative.      Objective:   BP 124/78   Pulse 84   Ht 5' (1.524 m)   Wt 279 lb 3.2 oz (126.6 kg)   SpO2 96%   BMI 54.53 kg/m   Vitals:   05/24/22 0852  BP: 124/78  Pulse: 84  Height: 5' (1.524 m)  Weight: 279 lb 3.2 oz (126.6 kg)  SpO2: 96%  BMI (Calculated): 54.53    Physical Exam Vitals and nursing note reviewed.  Constitutional:      Appearance:  Normal appearance. She is well-developed. She is obese. She is not ill-appearing.  HENT:     Head: Normocephalic.  Eyes:     Pupils: Pupils are equal, round, and reactive to light.  Cardiovascular:     Rate and Rhythm: Normal rate.  Pulmonary:     Effort: Pulmonary effort is normal.  Neurological:     Mental Status: She is alert.  Psychiatric:        Behavior: Behavior is cooperative.      No results found for any visits on 05/24/22.  Recent Results (from the past 2160 hour(s))  POCT urine pregnancy     Status: None   Collection Time: 03/25/22  2:37 PM  Result Value Ref Range   Preg Test, Ur Negative Negative       Assessment & Plan:   Problem List Items Addressed This Visit       Digestive   Autoimmune hepatitis (HCC)     Hematopoietic and Hemostatic   Factor V deficiency (HCC)    PT/INR were WNL, will manage with lab checks at follow up appointments.         Other   Bipolar I disorder, current or most recent episode depressed, in partial remission with mixed features [facility])    Patient  is seen by psychiatry and medications are managed there.  Will defer to them for treatment.   Will reassess at follow up.        Delusional disorder, somatic type, continuous (HCC)    Patient is seen by psychiatry and medications are managed there.  Will defer to them for treatment.    Will reassess at follow up.       Body mass index (BMI) of 50-59.9 in adult [facility])    Continue current meds.  Will adjust as needed based on results.  The patient is asked to make an attempt to improve diet and exercise patterns to aid in medical management of this problem. Addressed importance of increasing and maintaining water intake.        Return in about 3 months (around 08/24/2022) for F/U.   Total time spent: 20 minutes  Miki Kins, FNP  05/24/2022

## 2022-05-24 NOTE — Assessment & Plan Note (Signed)
Patient is seen by psychiatry and medications are managed there.  Will defer to them for treatment.   Will reassess at follow up.   

## 2022-05-24 NOTE — Assessment & Plan Note (Signed)
PT/INR were WNL, will manage with lab checks at follow up appointments.  

## 2022-06-09 ENCOUNTER — Encounter: Payer: Self-pay | Admitting: Psychiatry

## 2022-06-09 ENCOUNTER — Ambulatory Visit (INDEPENDENT_AMBULATORY_CARE_PROVIDER_SITE_OTHER): Payer: 59 | Admitting: Psychiatry

## 2022-06-09 DIAGNOSIS — F902 Attention-deficit hyperactivity disorder, combined type: Secondary | ICD-10-CM

## 2022-06-09 DIAGNOSIS — F4312 Post-traumatic stress disorder, chronic: Secondary | ICD-10-CM

## 2022-06-09 DIAGNOSIS — F3175 Bipolar disorder, in partial remission, most recent episode depressed: Secondary | ICD-10-CM | POA: Diagnosis not present

## 2022-06-09 MED ORDER — AMPHETAMINE-DEXTROAMPHETAMINE 20 MG PO TABS: ORAL_TABLET | ORAL | 0 refills | Status: DC

## 2022-06-09 MED ORDER — BUPROPION HCL ER (XL) 150 MG PO TB24: 150.0000 mg | ORAL_TABLET | Freq: Every day | ORAL | 1 refills | Status: DC

## 2022-06-09 MED ORDER — LAMOTRIGINE 200 MG PO TABS: ORAL_TABLET | ORAL | 1 refills | Status: DC

## 2022-06-09 MED ORDER — ASENAPINE MALEATE 5 MG SL SUBL: 5.0000 mg | SUBLINGUAL_TABLET | Freq: Two times a day (BID) | SUBLINGUAL | 1 refills | Status: DC

## 2022-06-09 NOTE — Progress Notes (Signed)
COBI MALCOMB [id number] [birth year] 52 y.o.  Subjective:   Patient ID:  Dana Rodgers is a 52 y.o. (DOB [birth year]) female.  Chief Complaint:  Chief Complaint  Patient presents with   Follow-up    Anxiety, mood disturbance, ADHD, and insomnia    HPI Dana Rodgers presents to the office today for follow-up of ADHD, anxiety, and mood disturbance.   She reports, "I attempted to go back to work" and this did not go well. She reports that she did not do well with changes in her schedule. She would have to wake up at 3:30 am to open. She reports that this schedule disrupted her sleep schedule and meals. She reports that she plans out her activities. She reports that friends "have learned" that she does not do things on a whim. She reports that certain routines are helpful for her to complete tasks. She uses [employer] calendar to track her tasks and appointments.   She reports that she is "getting there" in terms of getting back into her routine. She enjoys being outside. She reports her mood is "kind of blah." She reports that depression is not severe. Denies any recent manic s/s to include impulsivity. She reports sleeping about 5 hours a night. Energy and motivation have been low at times. She reports that her appetite has been less. Concentration has been adequate with meds. Denies SI. She reports chronic paranoia- "that's always there."  She started having depression and panic attacks.   Sister that is 10 years older lives on her property now. Sister's son was having difficulties and he now lives in her tiny house so she could help take care of him.    Ambien last filled 05/31/22 x 4.  Alprazolam last filled 05/31/22 x 3.  Adderall last filled 05/18/22.   Past Psychiatric Medication Trials: Saphris Ambien Lamictal Wellbutrin XL Paxil Adderall Xanax  AIMS    Flowsheet Row Office Visit from 06/09/2022 in [facility] Office Visit from 08/20/2021 in [facility] Office Visit from 09/05/2020 in [facility] Office Visit from 05/19/2020 in [facility]  AIMS Total Score 0 0 0 1        Review of Systems:  Review of Systems  Gastrointestinal:  Positive for constipation.  Musculoskeletal:  Negative for gait problem.  Neurological:  Negative for tremors.  Psychiatric/Behavioral:         Please refer to HPI    Medications: I have reviewed the patient's current medications.  Current Outpatient Medications  Medication Sig Dispense Refill   ALPRAZolam (XANAX) 1 MG tablet TAKE 1 TABLET (1 MG TOTAL) BY MOUTH 2 (TWO) TIMES DAILY AS NEEDED. * 60 tablet 5   [START ON 08/10/2022] amphetamine-dextroamphetamine (ADDERALL) 20 MG tablet Take 1.5 tablets total 30 mg every morning and 1 tablet every noon and 1600 105 tablet 0   asenapine (SAPHRIS) 5 MG SUBL 24 hr tablet Place 1 tablet (5 mg total) under the tongue 2 (two) times daily. 180 tablet 1   azelastine (ASTELIN) 0.1 % nasal spray Place 1 spray into both nostrils 2 (two) times daily. Use in each nostril as directed 30 mL 12   buPROPion (WELLBUTRIN XL) 150 MG 24 hr tablet Take 1 tablet (150 mg total) by mouth daily after breakfast. 90 tablet 1   fexofenadine (ALLEGRA) 180 MG tablet Take 1 tablet (180 mg total) by mouth daily. 90 tablet 1   lamoTRIgine (LAMICTAL) 200  MG tablet TAKE 1 TABLET BY MOUTH EVERYDAY AT BEDTIME 90 tablet 1   Semaglutide-Weight Management 1.7 MG/0.75ML SOAJ Inject 1.7 mg into the skin once a week for 28 days. 3 mL 0   [START ON 07/02/2022] Semaglutide-Weight Management 2.4 MG/0.75ML SOAJ Inject 2.4 mg into the skin once a week for 28 days. 3 mL 0   VENTOLIN HFA 108 (90 Base) MCG/ACT inhaler      Vitamin D, Ergocalciferol, (DRISDOL) 1.25 MG (50000 UNIT) CAPS capsule Take 1 capsule (50,000 Units total) by mouth every 7 (seven) days. 12 capsule 3   zolpidem (AMBIEN) 10 MG tablet Take 1 tablet (10 mg  total) by mouth at bedtime as needed for sleep. 30 tablet 5   No current facility-administered medications for this visit.    Medication Side Effects: Other: Possible constipation  Allergies:  Allergies  Allergen Reactions   Latex Anaphylaxis   Phenobarbital Other (See Comments)    Caused seizure  Other Reaction(s): seizure    Past Medical History:  Diagnosis Date   ADD (attention deficit disorder)    Bipolar disorder (HCC)    Clotting disorder (HCC)    History of appendectomy 02/19/2022   History of cholecystectomy 02/19/2022    Past Medical History, Surgical history, Social history, and Family history were reviewed and updated as appropriate.   Please see review of systems for further details on the patient's review from today.   Objective:   Physical Exam:  BP 135/72   Pulse 73   Wt 276 lb (125.2 kg)   BMI 53.90 kg/m   Physical Exam Constitutional:      General: She is not in acute distress. Musculoskeletal:        General: No deformity.  Neurological:     Mental Status: She is alert and oriented to person, place, and time.     Coordination: Coordination normal.  Psychiatric:        Attention and Perception: Attention and perception normal. She does not perceive auditory or visual hallucinations.        Mood and Affect: Mood is not depressed. Affect is not labile, blunt, angry or inappropriate.        Speech: Speech normal.        Behavior: Behavior normal.        Thought Content: Thought content normal. Thought content is not paranoid or delusional. Thought content does not include homicidal or suicidal ideation. Thought content does not include homicidal or suicidal plan.        Cognition and Memory: Cognition and memory normal.        Judgment: Judgment normal.     Comments: Insight intact Mood is mildly anxious     Lab Review:     Component Value Date/Time   NA 141 02/08/2022 1411   K 4.4 02/08/2022 1411   CL 104 02/08/2022 1411   CO2 24 (A)  02/08/2022 1411   BUN 13 02/08/2022 1411   CREATININE 1.0 02/08/2022 1411   CALCIUM 9.8 02/08/2022 1411   ALBUMIN 4.5 02/08/2022 1411   AST 21 02/08/2022 1411   ALT 18 02/08/2022 1411   ALKPHOS 100 02/08/2022 1411       Component Value Date/Time   WBC 8.9 02/08/2022 1411   RBC 4.35 02/08/2022 1411   HGB 13.6 02/08/2022 1411   HCT 40 02/08/2022 1411   PLT 293 02/08/2022 1411    No results found for: "POCLITH", "LITHIUM"   No results found for: "PHENYTOIN", "PHENOBARB", "VALPROATE", "CBMZ"   .  res Assessment: Plan:    Will continue current plan of care since target signs and symptoms are well controlled without any tolerability issues. Continue Lamictal 200 mg po qhs for mood stabilization.  Continue Adderall 30 mg in the morning and 20 mg at noon and 1600 for ADHD.  Continue Saphris 5 mg SL BID for mood stabilization and paranoia.  Continue Wellbutrin XL 150 mg po qd for depression.  Continue Ambien 10 mg po QHS prn insomnia.  Pt to follow-up in 3 months or sooner if clinically indicated.  Patient advised to contact office with any questions, adverse effects, or acute worsening in signs and symptoms.  Dana Rodgers was seen today for follow-up.  Diagnoses and all orders for this visit:  Chronic post-traumatic stress disorder -     lamoTRIgine (LAMICTAL) 200 MG tablet; TAKE 1 TABLET BY MOUTH EVERYDAY AT BEDTIME  Bipolar I disorder, current or most recent episode depressed, in partial remission with mixed features (HCC) -     lamoTRIgine (LAMICTAL) 200 MG tablet; TAKE 1 TABLET BY MOUTH EVERYDAY AT BEDTIME -     buPROPion (WELLBUTRIN XL) 150 MG 24 hr tablet; Take 1 tablet (150 mg total) by mouth daily after breakfast. -     asenapine (SAPHRIS) 5 MG SUBL 24 hr tablet; Place 1 tablet (5 mg total) under the tongue 2 (two) times daily.  Attention deficit hyperactivity disorder, combined type -     buPROPion (WELLBUTRIN XL) 150 MG 24 hr tablet; Take 1 tablet (150 mg total) by mouth daily  after breakfast. -     amphetamine-dextroamphetamine (ADDERALL) 20 MG tablet; Take 1.5 tablets total 30 mg every morning and 1 tablet every noon and 1600     Please see After Visit Summary for patient specific instructions.  Future Appointments  Date Time Provider Department Center  08/24/2022  9:00 AM Miki Kins, FNP AMA-AMA None  09/08/2022  8:30 AM Corie Chiquito, PMHNP [facility] None    No orders of the defined types were placed in this encounter.   -------------------------------

## 2022-07-03 ENCOUNTER — Other Ambulatory Visit: Payer: Self-pay | Admitting: Psychiatry

## 2022-07-03 DIAGNOSIS — F4312 Post-traumatic stress disorder, chronic: Secondary | ICD-10-CM

## 2022-07-03 DIAGNOSIS — G4700 Insomnia, unspecified: Secondary | ICD-10-CM

## 2022-07-13 ENCOUNTER — Other Ambulatory Visit: Payer: Self-pay

## 2022-08-05 ENCOUNTER — Other Ambulatory Visit: Payer: Self-pay | Admitting: Family

## 2022-08-05 ENCOUNTER — Other Ambulatory Visit: Payer: Self-pay

## 2022-08-05 MED ORDER — SEMAGLUTIDE-WEIGHT MANAGEMENT 2.4 MG/0.75ML ~~LOC~~ SOAJ: 2.4000 mg | SUBCUTANEOUS | 0 refills | Status: DC

## 2022-08-05 NOTE — Telephone Encounter (Signed)
Spoke with pt verbalized understanding 

## 2022-08-23 ENCOUNTER — Other Ambulatory Visit: Payer: Self-pay | Admitting: Psychiatry

## 2022-08-23 DIAGNOSIS — F3175 Bipolar disorder, in partial remission, most recent episode depressed: Secondary | ICD-10-CM

## 2022-08-23 DIAGNOSIS — F902 Attention-deficit hyperactivity disorder, combined type: Secondary | ICD-10-CM

## 2022-08-24 ENCOUNTER — Ambulatory Visit (INDEPENDENT_AMBULATORY_CARE_PROVIDER_SITE_OTHER): Payer: PRIVATE HEALTH INSURANCE | Admitting: Family

## 2022-08-24 ENCOUNTER — Encounter: Payer: Self-pay | Admitting: Family

## 2022-08-24 ENCOUNTER — Other Ambulatory Visit: Payer: Self-pay | Admitting: Psychiatry

## 2022-08-24 VITALS — BP 125/75 | HR 70 | Ht 60.0 in | Wt 265.4 lb

## 2022-08-24 DIAGNOSIS — E039 Hypothyroidism, unspecified: Secondary | ICD-10-CM | POA: Diagnosis not present

## 2022-08-24 DIAGNOSIS — Z6841 Body Mass Index (BMI) 40.0 and over, adult: Secondary | ICD-10-CM | POA: Diagnosis not present

## 2022-08-24 DIAGNOSIS — K754 Autoimmune hepatitis: Secondary | ICD-10-CM

## 2022-08-24 DIAGNOSIS — F902 Attention-deficit hyperactivity disorder, combined type: Secondary | ICD-10-CM

## 2022-08-24 DIAGNOSIS — D6851 Activated protein C resistance: Secondary | ICD-10-CM | POA: Insufficient documentation

## 2022-08-24 DIAGNOSIS — R7303 Prediabetes: Secondary | ICD-10-CM

## 2022-08-24 DIAGNOSIS — E538 Deficiency of other specified B group vitamins: Secondary | ICD-10-CM

## 2022-08-24 DIAGNOSIS — F3175 Bipolar disorder, in partial remission, most recent episode depressed: Secondary | ICD-10-CM

## 2022-08-24 DIAGNOSIS — D682 Hereditary deficiency of other clotting factors: Secondary | ICD-10-CM | POA: Diagnosis not present

## 2022-08-24 DIAGNOSIS — E559 Vitamin D deficiency, unspecified: Secondary | ICD-10-CM

## 2022-08-24 DIAGNOSIS — E782 Mixed hyperlipidemia: Secondary | ICD-10-CM

## 2022-08-24 NOTE — Assessment & Plan Note (Signed)
Checking INR today.  Will adjust meds as needed based on results.   Recheck at follow up.

## 2022-08-24 NOTE — Assessment & Plan Note (Signed)
Continue current meds.  Will adjust as needed based on results.  The patient is asked to make an attempt to improve diet and exercise patterns to aid in medical management of this problem. Addressed importance of increasing and maintaining water intake.   

## 2022-08-24 NOTE — Progress Notes (Signed)
Established Patient Office Visit  Subjective:  Patient ID: Dana Rodgers, female    DOB: November 20, 1970  Age: 52 y.o. MRN: [id number]  Chief Complaint  Patient presents with   Follow-up    3 mo F/U    Patient is here today for her 3 months follow up.  She has been feeling fairly well since last appointment.   She does not have additional concerns to discuss today.  Labs are due today. She needs refills.   I have reviewed her active problem list, medication list, allergies, notes from last encounter, lab results for her appointment today.    No other concerns at this time.   Past Medical History:  Diagnosis Date   ADD (attention deficit disorder)    Bipolar disorder (HCC)    Clotting disorder (HCC)    History of appendectomy 02/19/2022   History of cholecystectomy 02/19/2022   History of DVT (deep vein thrombosis) 04/15/2015    Past Surgical History:  Procedure Laterality Date   APPENDECTOMY     CHOLECYSTECTOMY      Social History   Socioeconomic History   Marital status: Married    Spouse name: Not on file   Number of children: Not on file   Years of education: Not on file   Highest education level: Not on file  Occupational History   Occupation: works at [employer]  Tobacco Use   Smoking status: Never   Smokeless tobacco: Never  Vaping Use   Vaping status: Never Used  Substance and Sexual Activity   Alcohol use: No   Drug use: No   Sexual activity: Not on file    Comment: married  Other Topics Concern   Not on file  Social History Narrative   Not on file   Social Determinants of Health   Financial Resource Strain: Not on file  Food Insecurity: Not on file  Transportation Needs: Not on file  Physical Activity: Not on file  Stress: Not on file  Social Connections: Not on file  Intimate Partner Violence: Not on file    Family History  Problem Relation Age of Onset   Osteoporosis Mother    Clotting disorder Sister        DVT post op    Bipolar disorder Sister    Lupus Sister    Osteoporosis Maternal Grandmother    Dementia Maternal Grandmother    Cancer Maternal Grandfather        Throat Cancer   Cancer Paternal Grandmother        breast ca   Clotting disorder Paternal Grandfather        DVT    Allergies  Allergen Reactions   Latex Anaphylaxis   Phenobarbital Other (See Comments)    Caused seizure  Other Reaction(s): seizure    Review of Systems  All other systems reviewed and are negative.      Objective:   BP 125/75   Pulse 70   Ht 5' (1.524 m)   Wt 265 lb 6.4 oz (120.4 kg)   SpO2 98%   BMI 51.83 kg/m   Vitals:   08/24/22 0909  BP: 125/75  Pulse: 70  Height: 5' (1.524 m)  Weight: 265 lb 6.4 oz (120.4 kg)  SpO2: 98%  BMI (Calculated): 51.83    Physical Exam Vitals and nursing note reviewed.  Constitutional:      Appearance: Normal appearance. She is normal weight.  HENT:     Head: Normocephalic.  Eyes:  Extraocular Movements: Extraocular movements intact.     Conjunctiva/sclera: Conjunctivae normal.     Pupils: Pupils are equal, round, and reactive to light.  Cardiovascular:     Rate and Rhythm: Normal rate.  Pulmonary:     Effort: Pulmonary effort is normal.  Neurological:     General: No focal deficit present.     Mental Status: She is alert and oriented to person, place, and time. Mental status is at baseline.  Psychiatric:        Mood and Affect: Mood normal.        Behavior: Behavior normal.        Thought Content: Thought content normal.        Judgment: Judgment normal.      No results found for any visits on 08/24/22.  No results found for this or any previous visit (from the past 2160 hour(s)).     Assessment & Plan:   Problem List Items Addressed This Visit       Active Problems   Autoimmune hepatitis (HCC)    Liver enzymes WNL at lab draw.  Checking again today.   Will continue to monitor.        Relevant Orders   CMP14+EGFR   CBC with  Differential/Platelet   Factor V deficiency (HCC)    Patient stable.  Well controlled with current therapy.   Continue current meds.       Relevant Orders   CMP14+EGFR   CBC with Differential/Platelet   Protime-INR   Hypothyroidism - Primary    Patient stable.  Well controlled with current therapy.   Continue current meds.       Relevant Orders   CMP14+EGFR   TSH   CBC with Differential/Platelet   Body mass index (BMI) of 50-59.9 in adult [facility])    Continue current meds.  Will adjust as needed based on results.  The patient is asked to make an attempt to improve diet and exercise patterns to aid in medical management of this problem. Addressed importance of increasing and maintaining water intake.       Relevant Orders   CMP14+EGFR   CBC with Differential/Platelet   Activated protein C resistance (HCC)    Checking INR today.  Will adjust meds as needed based on results.   Recheck at follow up.       Relevant Orders   CMP14+EGFR   CBC with Differential/Platelet   Protime-INR   Other Visit Diagnoses     Vitamin D deficiency, unspecified       Checking labs today.  Will continue supplements as needed.   Relevant Orders   VITAMIN D 25 Hydroxy (Vit-D Deficiency, Fractures)   B12 deficiency due to diet       Checking labs today.  Will continue supplements as needed.   Relevant Orders   Vitamin B12   Prediabetes       Checking labs today Patient counseled on dietary choices and verbalized understanding.   Relevant Orders   Hemoglobin A1c   Mixed hyperlipidemia       Checking labs today.  Continue current therapy for lipid control. Will modify as needed based on labwork results.   Relevant Orders   Lipid panel       Return in about 4 months (around 12/24/2022) for F/U.   Total time spent: 20 minutes  Miki Kins, FNP  08/24/2022   This document may have been prepared by [facility] Voice Recognition software and as such may include  unintentional  dictation errors.

## 2022-08-24 NOTE — Assessment & Plan Note (Signed)
Liver enzymes WNL at lab draw.  Checking again today.   Will continue to monitor.

## 2022-08-24 NOTE — Assessment & Plan Note (Signed)
Patient stable.  Well controlled with current therapy.   Continue current meds.  

## 2022-09-08 ENCOUNTER — Ambulatory Visit (INDEPENDENT_AMBULATORY_CARE_PROVIDER_SITE_OTHER): Payer: 59 | Admitting: Psychiatry

## 2022-09-08 ENCOUNTER — Encounter: Payer: Self-pay | Admitting: Psychiatry

## 2022-09-08 DIAGNOSIS — F902 Attention-deficit hyperactivity disorder, combined type: Secondary | ICD-10-CM

## 2022-09-08 DIAGNOSIS — F4312 Post-traumatic stress disorder, chronic: Secondary | ICD-10-CM

## 2022-09-08 DIAGNOSIS — F3175 Bipolar disorder, in partial remission, most recent episode depressed: Secondary | ICD-10-CM | POA: Diagnosis not present

## 2022-09-08 MED ORDER — AMPHETAMINE-DEXTROAMPHETAMINE 20 MG PO TABS
ORAL_TABLET | ORAL | 0 refills | Status: DC
Start: 2022-10-18 — End: 2023-03-02

## 2022-09-08 MED ORDER — AMPHETAMINE-DEXTROAMPHETAMINE 20 MG PO TABS
ORAL_TABLET | ORAL | 0 refills | Status: DC
Start: 2022-11-15 — End: 2023-03-02

## 2022-09-08 MED ORDER — AMPHETAMINE-DEXTROAMPHETAMINE 20 MG PO TABS: ORAL_TABLET | ORAL | 0 refills | Status: DC

## 2022-09-08 MED ORDER — ASENAPINE MALEATE 5 MG SL SUBL
5.0000 mg | SUBLINGUAL_TABLET | Freq: Two times a day (BID) | SUBLINGUAL | 1 refills | Status: DC
Start: 2022-09-08 — End: 2023-03-02

## 2022-09-08 MED ORDER — BUPROPION HCL ER (XL) 150 MG PO TB24
150.0000 mg | ORAL_TABLET | Freq: Every day | ORAL | 1 refills | Status: DC
Start: 2022-09-08 — End: 2023-03-02

## 2022-09-08 MED ORDER — LAMOTRIGINE 200 MG PO TABS: ORAL_TABLET | ORAL | 1 refills | Status: DC

## 2022-09-08 NOTE — Progress Notes (Signed)
Dana Rodgers [id number] 1970/12/02 52 y.o.  Subjective:   Patient ID:  Dana Rodgers is a 52 y.o. (DOB [birth year]) female.  Chief Complaint:  Chief Complaint  Patient presents with   Follow-up    Anxiety, mood disturbance, ADHD, and insomnia    HPI Dana Rodgers presents to the office today for follow-up of anxiety, mood disturbance, and ADHD.   Youngest son just moved back home after being released from prison. Youngest son lived with his father since he was 72 yo and she did not have contact him for about 2 years. She reports that it has been an adjustment having someone living there, especially since her husband travels for work and is rarely home. Son has been applying to jobs.   She reports that her anxiety has been increased at times. She reports that she has been doing work outside when feeling stressed. She reports "a little" depression. She reports "some" manic symptoms- "I feel frazzly." She has noticed some increased spending. She reports that her sleep has been decreased and averaging about 5 hours a night. She reports that she wakes up thinking about what she needs to do. Some increase in goal-directed activity. Concentration is good and "probably hyper-focus." Denies SI.   She reports losing 50 lbs since January. She reports that she was initially getting take out more when son moved in. She reports, "I don't feel hungry a lot." She reports that she usually will make herself eat.   Working for a [employer].   Ambien last filled 09/02/22 x 3 Adderall last filled on 08/23/22. Alprazolam last filled 08/23/22 x 2.   Past Psychiatric Medication Trials: Saphris Ambien Lamictal Wellbutrin XL Paxil Adderall Xanax  AIMS    Flowsheet Row Office Visit from 06/09/2022 in [facility] Office Visit from 08/20/2021 in [facility] Office Visit from 09/05/2020 in [facility] Office  Visit from 05/19/2020 in [facility]  AIMS Total Score 0 0 0 1        Review of Systems:  Review of Systems  Musculoskeletal:  Negative for gait problem.  Hematological:  Bruises/bleeds easily.  Psychiatric/Behavioral:         Please refer to HPI    Medications: I have reviewed the patient's current medications.  Current Outpatient Medications  Medication Sig Dispense Refill   ALPRAZolam (XANAX) 1 MG tablet TAKE 1 TABLET (1 MG TOTAL) BY MOUTH 2 (TWO) TIMES DAILY AS NEEDED. * 60 tablet 5   amphetamine-dextroamphetamine (ADDERALL) 20 MG tablet Take 1.5 tablets total 30 mg every morning and 1 tablet every noon and 1600 105 tablet 0   asenapine (SAPHRIS) 5 MG SUBL 24 hr tablet Place 1 tablet (5 mg total) under the tongue 2 (two) times daily. 180 tablet 1   buPROPion (WELLBUTRIN XL) 150 MG 24 hr tablet TAKE 1 TABLET (150 MG TOTAL) BY MOUTH DAILY AFTER BREAKFAST. 30 tablet 0   fexofenadine (ALLEGRA) 180 MG tablet Take 1 tablet (180 mg total) by mouth daily. 90 tablet 1   lamoTRIgine (LAMICTAL) 200 MG tablet TAKE 1 TABLET BY MOUTH EVERYDAY AT BEDTIME 90 tablet 1   Vitamin D, Ergocalciferol, (DRISDOL) 1.25 MG (50000 UNIT) CAPS capsule Take 1 capsule (50,000 Units total) by mouth every 7 (seven) days. 12 capsule 3   zolpidem (AMBIEN) 10 MG tablet TAKE 1 TABLET BY MOUTH AT BEDTIME AS NEEDED FOR SLEEP. 30 tablet 5   azelastine (ASTELIN)  0.1 % nasal spray Place 1 spray into both nostrils 2 (two) times daily. Use in each nostril as directed (Patient not taking: Reported on 08/24/2022) 30 mL 12   Semaglutide-Weight Management 2.4 MG/0.75ML SOAJ Inject 2.4 mg into the skin once a week for 28 days. 3 mL 0   No current facility-administered medications for this visit.    Medication Side Effects: None  Allergies:  Allergies  Allergen Reactions   Latex Anaphylaxis   Phenobarbital Other (See Comments)    Caused seizure  Other Reaction(s): seizure    Past Medical  History:  Diagnosis Date   ADD (attention deficit disorder)    Bipolar disorder (HCC)    Clotting disorder (HCC)    History of appendectomy 02/19/2022   History of cholecystectomy 02/19/2022   History of DVT (deep vein thrombosis) 04/15/2015    Past Medical History, Surgical history, Social history, and Family history were reviewed and updated as appropriate.   Please see review of systems for further details on the patient's review from today.   Objective:   Physical Exam:  BP 119/77   Pulse 73   Physical Exam Constitutional:      General: She is not in acute distress. Musculoskeletal:        General: No deformity.  Neurological:     Mental Status: She is alert and oriented to person, place, and time.     Coordination: Coordination normal.  Psychiatric:        Attention and Perception: Attention and perception normal. She does not perceive auditory or visual hallucinations.        Mood and Affect: Mood is not depressed. Affect is not labile, blunt, angry or inappropriate.        Speech: Speech normal.        Behavior: Behavior normal.        Thought Content: Thought content normal. Thought content is not paranoid or delusional. Thought content does not include homicidal or suicidal ideation. Thought content does not include homicidal or suicidal plan.        Cognition and Memory: Cognition and memory normal.        Judgment: Judgment normal.     Comments: Insight intact Mood is mildly anxious     Lab Review:     Component Value Date/Time   NA 140 08/24/2022 1057   K 4.4 08/24/2022 1057   CL 103 08/24/2022 1057   CO2 24 08/24/2022 1057   GLUCOSE 89 08/24/2022 1057   BUN 13 08/24/2022 1057   CREATININE 1.16 (H) 08/24/2022 1057   CALCIUM 9.6 08/24/2022 1057   PROT 6.9 08/24/2022 1057   ALBUMIN 4.2 08/24/2022 1057   AST 16 08/24/2022 1057   ALT 20 08/24/2022 1057   ALKPHOS 117 08/24/2022 1057   BILITOT 0.6 08/24/2022 1057       Component Value Date/Time    WBC 8.9 02/08/2022 1411   RBC 4.35 02/08/2022 1411   HGB 13.6 02/08/2022 1411   HCT 40 02/08/2022 1411   PLT 293 02/08/2022 1411    No results found for: "POCLITH", "LITHIUM"   No results found for: "PHENYTOIN", "PHENOBARB", "VALPROATE", "CBMZ"   .res Assessment: Plan:    Will continue current plan of care since target signs and symptoms are well controlled without any tolerability issues. Continue Asenapine 5 mg twice daily for mood stabilization.  Continue Lamictal 200 mg at bedtime for mood stabilization.  Continue Wellbutrin XL 150 mg daily for depression.  Continue Adderall 30 mg in  the morning, 20 mg at noon and 20 mg at 1600 for ADHD.  Continue Alprazolam 1 mg twice daily as needed for anxiety.  Continue Ambien 10 mg at bedtime as needed for insomnia. Pt to follow-up in 3 months or sooner if clinically indicated.  Patient advised to contact office with any questions, adverse effects, or acute worsening in signs and symptoms.   Dana Rodgers was seen today for follow-up.  Diagnoses and all orders for this visit:  Attention deficit hyperactivity disorder, combined type  Bipolar I disorder, current or most recent episode depressed, in partial remission with mixed features (HCC)  Chronic post-traumatic stress disorder     Please see After Visit Summary for patient specific instructions.  Future Appointments  Date Time Provider Department Center  12/24/2022  9:15 AM Miki Kins, FNP AMA-AMA None    No orders of the defined types were placed in this encounter.   -------------------------------

## 2022-10-01 ENCOUNTER — Other Ambulatory Visit: Payer: Self-pay | Admitting: Family

## 2022-10-01 ENCOUNTER — Other Ambulatory Visit: Payer: Self-pay

## 2022-10-01 MED ORDER — SEMAGLUTIDE-WEIGHT MANAGEMENT 2.4 MG/0.75ML ~~LOC~~ SOAJ: 2.4000 mg | SUBCUTANEOUS | 0 refills | Status: DC

## 2022-10-29 ENCOUNTER — Other Ambulatory Visit: Payer: Self-pay | Admitting: Family

## 2022-10-29 MED ORDER — SEMAGLUTIDE-WEIGHT MANAGEMENT 2.4 MG/0.75ML ~~LOC~~ SOAJ: 2.4000 mg | SUBCUTANEOUS | 0 refills | Status: DC

## 2022-11-22 ENCOUNTER — Other Ambulatory Visit: Payer: Self-pay | Admitting: Family

## 2022-11-23 ENCOUNTER — Other Ambulatory Visit: Payer: Self-pay

## 2022-11-23 MED ORDER — SEMAGLUTIDE-WEIGHT MANAGEMENT 2.4 MG/0.75ML ~~LOC~~ SOAJ: 2.4000 mg | SUBCUTANEOUS | 0 refills | Status: DC

## 2022-12-01 ENCOUNTER — Encounter: Payer: Self-pay | Admitting: Psychiatry

## 2022-12-09 ENCOUNTER — Ambulatory Visit: Payer: 59 | Admitting: Psychiatry

## 2022-12-16 ENCOUNTER — Other Ambulatory Visit: Payer: Self-pay | Admitting: Family

## 2022-12-17 ENCOUNTER — Other Ambulatory Visit: Payer: Self-pay

## 2022-12-20 MED ORDER — SEMAGLUTIDE-WEIGHT MANAGEMENT 2.4 MG/0.75ML ~~LOC~~ SOAJ: 2.4000 mg | SUBCUTANEOUS | 0 refills | Status: DC

## 2022-12-23 ENCOUNTER — Other Ambulatory Visit: Payer: Self-pay | Admitting: Family

## 2022-12-23 MED ORDER — SEMAGLUTIDE-WEIGHT MANAGEMENT 2.4 MG/0.75ML ~~LOC~~ SOAJ: 2.4000 mg | SUBCUTANEOUS | 3 refills | Status: DC

## 2022-12-24 ENCOUNTER — Ambulatory Visit (INDEPENDENT_AMBULATORY_CARE_PROVIDER_SITE_OTHER): Payer: PRIVATE HEALTH INSURANCE | Admitting: Family

## 2022-12-24 VITALS — BP 128/72 | HR 80 | Ht 63.0 in | Wt 254.4 lb

## 2022-12-24 DIAGNOSIS — E039 Hypothyroidism, unspecified: Secondary | ICD-10-CM

## 2022-12-24 DIAGNOSIS — Z1159 Encounter for screening for other viral diseases: Secondary | ICD-10-CM

## 2022-12-24 DIAGNOSIS — E559 Vitamin D deficiency, unspecified: Secondary | ICD-10-CM | POA: Diagnosis not present

## 2022-12-24 DIAGNOSIS — D682 Hereditary deficiency of other clotting factors: Secondary | ICD-10-CM

## 2022-12-24 DIAGNOSIS — E538 Deficiency of other specified B group vitamins: Secondary | ICD-10-CM

## 2022-12-24 DIAGNOSIS — R7303 Prediabetes: Secondary | ICD-10-CM

## 2022-12-24 DIAGNOSIS — E782 Mixed hyperlipidemia: Secondary | ICD-10-CM

## 2022-12-24 DIAGNOSIS — F3175 Bipolar disorder, in partial remission, most recent episode depressed: Secondary | ICD-10-CM

## 2022-12-24 DIAGNOSIS — K754 Autoimmune hepatitis: Secondary | ICD-10-CM

## 2022-12-24 DIAGNOSIS — Z6841 Body Mass Index (BMI) 40.0 and over, adult: Secondary | ICD-10-CM

## 2022-12-24 DIAGNOSIS — Z114 Encounter for screening for human immunodeficiency virus [HIV]: Secondary | ICD-10-CM

## 2022-12-25 ENCOUNTER — Encounter: Payer: Self-pay | Admitting: Family

## 2022-12-25 NOTE — Progress Notes (Unsigned)
Established Patient Office Visit  Subjective:  Patient ID: Dana Rodgers, female    DOB: 1970/09/24  Age: 52 y.o. MRN: [id number]  Chief Complaint  Patient presents with  . Follow-up    Patient is here today for her follow up.  She has been feeling well since last appointment.   She does not have additional concerns to discuss today.  She has continued to use weight with the [facility].   Labs are due today. She needs refills.   I have reviewed her active problem list, medication list, allergies, health maintenance, notes from last encounter, lab results for her appointment today.     No other concerns at this time.   Past Medical History:  Diagnosis Date  . ADD (attention deficit disorder)   . Bipolar disorder (HCC)   . Clotting disorder (HCC)   . History of appendectomy 02/19/2022  . History of cholecystectomy 02/19/2022  . History of DVT (deep vein thrombosis) 04/15/2015    Past Surgical History:  Procedure Laterality Date  . APPENDECTOMY    . CHOLECYSTECTOMY      Social History   Socioeconomic History  . Marital status: Married    Spouse name: Not on file  . Number of children: Not on file  . Years of education: Not on file  . Highest education level: Not on file  Occupational History  . Occupation: works at [employer]  . Smoking status: Never  . Smokeless tobacco: Never  Vaping Use  . Vaping status: Never Used  Substance and Sexual Activity  . Alcohol use: No  . Drug use: No  . Sexual activity: Not on file    Comment: married  Other Topics Concern  . Not on file  Social History Narrative  . Not on file   Social Determinants of Health   Financial Resource Strain: Not on file  Food Insecurity: Not on file  Transportation Needs: Not on file  Physical Activity: Not on file  Stress: Not on file  Social Connections: Not on file  Intimate Partner Violence: Not on file    Family History  Problem Relation Age of Onset  .  Osteoporosis Mother   . Clotting disorder Sister        DVT post op  . Bipolar disorder Sister   . Lupus Sister   . Osteoporosis Maternal Grandmother   . Dementia Maternal Grandmother   . Cancer Maternal Grandfather        Throat Cancer  . Cancer Paternal Grandmother        breast ca  . Clotting disorder Paternal Grandfather        DVT    Allergies  Allergen Reactions  . Latex Anaphylaxis  . Phenobarbital Other (See Comments)    Caused seizure  Other Reaction(s): seizure    Review of Systems  All other systems reviewed and are negative.      Objective:   BP 128/72   Pulse 80   Ht 5\' 3"  (1.6 m)   Wt 254 lb 6.4 oz (115.4 kg)   SpO2 96%   BMI 45.06 kg/m   Vitals:   12/24/22 0927  BP: 128/72  Pulse: 80  Height: 5\' 3"  (1.6 m)  Weight: 254 lb 6.4 oz (115.4 kg)  SpO2: 96%  BMI (Calculated): 45.08    Physical Exam Vitals and nursing note reviewed.  Constitutional:      Appearance: Normal appearance. She is normal weight.  HENT:  Head: Normocephalic.  Eyes:     Extraocular Movements: Extraocular movements intact.     Conjunctiva/sclera: Conjunctivae normal.     Pupils: Pupils are equal, round, and reactive to light.  Cardiovascular:     Rate and Rhythm: Normal rate.  Pulmonary:     Effort: Pulmonary effort is normal.  Neurological:     General: No focal deficit present.     Mental Status: She is alert and oriented to person, place, and time. Mental status is at baseline.  Psychiatric:        Mood and Affect: Mood normal.        Behavior: Behavior normal.        Thought Content: Thought content normal.        Judgment: Judgment normal.     No results found for any visits on 12/24/22.  No results found for this or any previous visit (from the past 2160 hour(s)).     Assessment & Plan:   Problem List Items Addressed This Visit       Active Problems   Factor V deficiency (HCC)   Relevant Orders   CMP14+EGFR   CBC with Diff   Protime-INR    Hypothyroidism   Relevant Orders   CMP14+EGFR   TSH   CBC with Diff   Other Visit Diagnoses     Vitamin D deficiency, unspecified    -  Primary   Relevant Orders   VITAMIN D 25 Hydroxy (Vit-D Deficiency, Fractures)   CMP14+EGFR   CBC with Diff   B12 deficiency due to diet       Relevant Orders   CMP14+EGFR   Vitamin B12   CBC with Diff   Prediabetes       Relevant Orders   CMP14+EGFR   Hemoglobin A1c   CBC with Diff   Mixed hyperlipidemia       Relevant Orders   Lipid panel   CMP14+EGFR   CBC with Diff   Need for hepatitis C screening test       Relevant Orders   CMP14+EGFR   CBC with Diff   Hepatitis C Ab reflex to Quant PCR   Screening for HIV without presence of risk factors       Relevant Orders   CMP14+EGFR   CBC with Diff   HIV antibody (with reflex)       Return in about 3 months (around 03/24/2023) for F/U.   Total time spent: {AMA time spent:29001} minutes  Miki Kins, FNP  12/24/2022   This document may have been prepared by [facility] Voice Recognition software and as such may include unintentional dictation errors.

## 2022-12-26 NOTE — Assessment & Plan Note (Signed)
Patient is seen by Psychiatry, who manage this condition.  She is well controlled with current therapy.   Will defer to them for further changes to plan of care.

## 2022-12-26 NOTE — Assessment & Plan Note (Signed)
Patient stable.  Well controlled with current therapy.   Continue current meds.  

## 2022-12-26 NOTE — Assessment & Plan Note (Signed)
Continue current meds.  Will adjust as needed based on results.  The patient is asked to make an attempt to improve diet and exercise patterns to aid in medical management of this problem. Addressed importance of increasing and maintaining water intake.   

## 2023-01-02 ENCOUNTER — Other Ambulatory Visit: Payer: Self-pay | Admitting: Family

## 2023-01-02 ENCOUNTER — Other Ambulatory Visit: Payer: Self-pay | Admitting: Psychiatry

## 2023-01-02 DIAGNOSIS — G4700 Insomnia, unspecified: Secondary | ICD-10-CM

## 2023-01-02 NOTE — Telephone Encounter (Signed)
Was  a no show last month. Sent MyChart message to schedule FU.

## 2023-01-05 NOTE — Telephone Encounter (Signed)
Please call to schedule FU. Past due and has not responded to MyChart request to schedule.

## 2023-01-05 NOTE — Telephone Encounter (Signed)
Pt called at 1:55p requesting refill of Zolpidem and Adderall to   [id number] [place], [place] - [address] AT [facility] [address] [place] [address] Phone: [phone]  Fax: [phone]   Pt is out of meds.  Pt is not able to get an appt with Shanda Bumps before JC leaves on Jan 9.  She is asking who JC thinks she should see here between [place].  Then I will call her back to schedule appt.

## 2023-01-06 ENCOUNTER — Other Ambulatory Visit: Payer: Self-pay | Admitting: Psychiatry

## 2023-01-06 DIAGNOSIS — F4312 Post-traumatic stress disorder, chronic: Secondary | ICD-10-CM

## 2023-01-06 NOTE — Telephone Encounter (Signed)
Sent to me by mistake 

## 2023-01-06 NOTE — Telephone Encounter (Signed)
Lf 11/14

## 2023-01-06 NOTE — Telephone Encounter (Signed)
Dana Rodgers advised Rosey Bath.  I called pt back and she has scheduled appt with Rosey Bath for 2/12.  Pls send her refills in.

## 2023-01-07 NOTE — Telephone Encounter (Signed)
Refill request needs to be sent to [facility] until she leaves and Rosey Bath has had visit with pt.

## 2023-01-21 ENCOUNTER — Encounter: Payer: Self-pay | Admitting: Family

## 2023-02-22 ENCOUNTER — Other Ambulatory Visit: Payer: Self-pay | Admitting: Family

## 2023-02-22 DIAGNOSIS — E559 Vitamin D deficiency, unspecified: Secondary | ICD-10-CM

## 2023-03-02 ENCOUNTER — Ambulatory Visit: Payer: PRIVATE HEALTH INSURANCE | Admitting: Physician Assistant

## 2023-03-02 ENCOUNTER — Encounter: Payer: Self-pay | Admitting: Physician Assistant

## 2023-03-02 ENCOUNTER — Ambulatory Visit (INDEPENDENT_AMBULATORY_CARE_PROVIDER_SITE_OTHER): Payer: PRIVATE HEALTH INSURANCE | Admitting: Physician Assistant

## 2023-03-02 DIAGNOSIS — F902 Attention-deficit hyperactivity disorder, combined type: Secondary | ICD-10-CM

## 2023-03-02 DIAGNOSIS — F3175 Bipolar disorder, in partial remission, most recent episode depressed: Secondary | ICD-10-CM

## 2023-03-02 DIAGNOSIS — F4312 Post-traumatic stress disorder, chronic: Secondary | ICD-10-CM | POA: Diagnosis not present

## 2023-03-02 DIAGNOSIS — G4700 Insomnia, unspecified: Secondary | ICD-10-CM

## 2023-03-02 MED ORDER — AMPHETAMINE-DEXTROAMPHETAMINE 20 MG PO TABS: ORAL_TABLET | ORAL | 0 refills | Status: DC

## 2023-03-02 MED ORDER — ASENAPINE MALEATE 5 MG SL SUBL: 5.0000 mg | SUBLINGUAL_TABLET | Freq: Two times a day (BID) | SUBLINGUAL | 1 refills | Status: AC

## 2023-03-02 MED ORDER — AMPHETAMINE-DEXTROAMPHETAMINE 20 MG PO TABS
ORAL_TABLET | ORAL | 0 refills | Status: DC
Start: 2023-04-28 — End: 2023-08-25

## 2023-03-02 MED ORDER — BUPROPION HCL ER (XL) 150 MG PO TB24: 150.0000 mg | ORAL_TABLET | Freq: Every day | ORAL | 1 refills | Status: DC

## 2023-03-02 MED ORDER — ALPRAZOLAM 1 MG PO TABS: ORAL_TABLET | ORAL | 1 refills | Status: DC

## 2023-03-02 MED ORDER — LAMOTRIGINE 200 MG PO TABS: ORAL_TABLET | ORAL | 1 refills | Status: DC

## 2023-03-02 NOTE — Progress Notes (Signed)
[facility] Check  Patient ID: Dana Rodgers,  MRN: [id number]  PCP: Miki Kins, FNP  Date of Evaluation: 03/02/2023 Time spent:30 minutes  Chief Complaint:  Chief Complaint   Anxiety; Depression; ADHD; Follow-up     HISTORY/CURRENT STATUS: HPI Transfer from Corie Chiquito, NP  Doing well with current meds. Patient is able to enjoy things.  Energy and motivation are good.  Work is going well.   No extreme sadness, tearfulness, or feelings of hopelessness.  Sleeps well most of the time.  Needs the Ambien a lot of nights.  It is still effective.  ADLs and personal hygiene are normal.    Appetite has not changed.  Weight is stable.  Denies suicidal or homicidal thoughts.  States that attention is good without easy distractibility.  Able to focus on things and finish tasks to completion.   Usually takes the Xanax a few times a month, but sometimes up to a few times per week.  It depends on what is going on.  She does not have panic attacks but gets overwhelmed fairly easily depending on the situation.  Patient denies increased energy with decreased need for sleep, increased talkativeness, racing thoughts, impulsivity or risky behaviors, increased spending, increased libido, grandiosity, increased irritability or anger, paranoia, or hallucinations.  Review of Systems  Constitutional: Negative.   HENT: Negative.    Eyes: Negative.   Respiratory: Negative.    Cardiovascular: Negative.   Gastrointestinal: Negative.   Genitourinary: Negative.   Musculoskeletal: Negative.   Skin: Negative.   Neurological: Negative.   Endo/Heme/Allergies: Negative.   Psychiatric/Behavioral:         See HPI   Individual Medical History/ Review of Systems: Changes? :No   Past Psychiatric Medication Trials: Saphris Ambien Lamictal Wellbutrin XL Paxil Adderall Xanax  Allergies: Latex and Phenobarbital  Current Medications:  Current Outpatient Medications:    fexofenadine  (ALLEGRA) 180 MG tablet, TAKE 1 TABLET BY MOUTH EVERY DAY, Disp: 100 tablet, Rfl: 1   Semaglutide-Weight Management 2.4 MG/0.75ML SOAJ, Inject 2.4 mg into the skin once a week., Disp: 3 mL, Rfl: 3   Vitamin D, Ergocalciferol, (DRISDOL) 1.25 MG (50000 UNIT) CAPS capsule, TAKE 1 CAPSULE (50,000 UNITS TOTAL) BY MOUTH EVERY 7 (SEVEN) DAYS, Disp: 12 capsule, Rfl: 3   zolpidem (AMBIEN) 10 MG tablet, TAKE 1 TABLET BY MOUTH EVERY DAY AT BEDTIME AS NEEDED FOR SLEEP, Disp: 30 tablet, Rfl: 5   ALPRAZolam (XANAX) 1 MG tablet, TAKE 1 TABLET (1 MG TOTAL) BY MOUTH 2 (TWO) TIMES DAILY AS NEEDED. *, Disp: 60 tablet, Rfl: 1   amphetamine-dextroamphetamine (ADDERALL) 20 MG tablet, Take 1.5 tablets in the morning and 1 tablet at noon and 1600, Disp: 105 tablet, Rfl: 0   [START ON 03/29/2023] amphetamine-dextroamphetamine (ADDERALL) 20 MG tablet, Take 1.5 tablets in the morning and 1 tablet every morning and at 1600, Disp: 105 tablet, Rfl: 0   [START ON 04/28/2023] amphetamine-dextroamphetamine (ADDERALL) 20 MG tablet, Take 1.5 tablets total 30 mg every morning and 1 tablet every noon and 1600, Disp: 105 tablet, Rfl: 0   asenapine (SAPHRIS) 5 MG SUBL 24 hr tablet, Place 1 tablet (5 mg total) under the tongue 2 (two) times daily., Disp: 180 tablet, Rfl: 1   azelastine (ASTELIN) 0.1 % nasal spray, Place 1 spray into both nostrils 2 (two) times daily. Use in each nostril as directed (Patient not taking: Reported on 03/02/2023), Disp: 30 mL, Rfl: 12   buPROPion (WELLBUTRIN XL) 150 MG 24 hr tablet, Take  1 tablet (150 mg total) by mouth daily after breakfast., Disp: 90 tablet, Rfl: 1   lamoTRIgine (LAMICTAL) 200 MG tablet, TAKE 1 TABLET BY MOUTH EVERYDAY AT BEDTIME, Disp: 90 tablet, Rfl: 1 Medication Side Effects: none  Family Medical/ Social History: Changes? No  MENTAL HEALTH EXAM:  There were no vitals taken for this visit.There is no height or weight on file to calculate BMI.  General Appearance: Casual, Well Groomed, and  Obese  Eye Contact:  Good  Speech:  Clear and Coherent and Normal Rate  Volume:  Normal  Mood:  Euthymic  Affect:  Congruent  Thought Process:  Goal Directed and Descriptions of Associations: Circumstantial  Orientation:  Full (Time, Place, and Person)  Thought Content: Logical   Suicidal Thoughts:  No  Homicidal Thoughts:  No  Memory:  WNL  Judgement:  Good  Insight:  Good  Psychomotor Activity:  Normal  Concentration:  Concentration: Good  Recall:  Good  Fund of Knowledge: Good  Language: Good  Assets:  Communication Skills Desire for Improvement Financial Resources/Insurance Housing Transportation Vocational/Educational  ADL's:  Intact  Cognition: WNL  Prognosis:  Good   DIAGNOSES:    ICD-10-CM   1. Chronic post-traumatic stress disorder  F43.12 ALPRAZolam (XANAX) 1 MG tablet    lamoTRIgine (LAMICTAL) 200 MG tablet    2. Attention deficit hyperactivity disorder, combined type  F90.2 amphetamine-dextroamphetamine (ADDERALL) 20 MG tablet    amphetamine-dextroamphetamine (ADDERALL) 20 MG tablet    amphetamine-dextroamphetamine (ADDERALL) 20 MG tablet    buPROPion (WELLBUTRIN XL) 150 MG 24 hr tablet    3. Bipolar I disorder, current or most recent episode depressed, in partial remission with mixed features (HCC)  F31.75 asenapine (SAPHRIS) 5 MG SUBL 24 hr tablet    buPROPion (WELLBUTRIN XL) 150 MG 24 hr tablet    lamoTRIgine (LAMICTAL) 200 MG tablet    4. Insomnia, unspecified type  G47.00       Receiving Psychotherapy: No   RECOMMENDATIONS:  PDMP reviewed.  Ambien filled 02/04/2023, Xanax filled 02/03/2023.  Adderall filled 01/06/2023. I provided 30 minutes of face to face time during this encounter, including time spent before and after the visit in records review, medical decision making, counseling pertinent to today's visit, and charting.   She is doing well on the current treatment so no changes will be made.  Continue Xanax 1 mg, 1 p.o. twice daily as  needed.  Take as sparingly as possible. Continue Adderall 20 mg, 1.5 pills every morning, 1 at noon, and 1 at 4:00. Continue Saphris 5 mg twice daily. Continue Wellbutrin XL 150 mg every morning. Continue Lamictal 200 mg nightly. Continue Ambien 10 mg, 1 p.o. nightly as needed sleep. Reminded her to get labs drawn that were ordered in December. Return in 6 months.   Melony Overly, PA-C

## 2023-03-24 ENCOUNTER — Encounter: Payer: Self-pay | Admitting: Family

## 2023-03-24 ENCOUNTER — Ambulatory Visit: Payer: PRIVATE HEALTH INSURANCE | Admitting: Family

## 2023-03-24 VITALS — BP 130/78 | HR 78 | Ht 63.0 in | Wt 254.2 lb

## 2023-03-24 DIAGNOSIS — E039 Hypothyroidism, unspecified: Secondary | ICD-10-CM

## 2023-03-24 DIAGNOSIS — E559 Vitamin D deficiency, unspecified: Secondary | ICD-10-CM | POA: Diagnosis not present

## 2023-03-24 DIAGNOSIS — Z6841 Body Mass Index (BMI) 40.0 and over, adult: Secondary | ICD-10-CM

## 2023-03-24 DIAGNOSIS — R7303 Prediabetes: Secondary | ICD-10-CM

## 2023-03-24 DIAGNOSIS — K754 Autoimmune hepatitis: Secondary | ICD-10-CM | POA: Diagnosis not present

## 2023-03-24 DIAGNOSIS — E538 Deficiency of other specified B group vitamins: Secondary | ICD-10-CM

## 2023-03-24 DIAGNOSIS — Z0130 Encounter for examination of blood pressure without abnormal findings: Secondary | ICD-10-CM

## 2023-03-24 NOTE — Assessment & Plan Note (Signed)
 Continue current meds.  Will adjust as needed based on results.  The patient is asked to make an attempt to improve diet and exercise patterns to aid in medical management of this problem. Addressed importance of increasing and maintaining water intake.

## 2023-03-24 NOTE — Assessment & Plan Note (Signed)
 Patient stable.  Well controlled with current therapy.   Continue current meds.

## 2023-03-24 NOTE — Progress Notes (Signed)
 Established Patient Office Visit  Subjective:  Patient ID: Dana Rodgers, female    DOB: [birth year]  Age: 53 y.o. MRN: [id number]  Chief Complaint  Patient presents with   Follow-up    3 month follow up    Patient is here today for her 3 months follow up.  She has been feeling fairly well since last appointment.   She does have additional concerns to discuss today.  She has pain on the front of her right thigh. She says that it feels like a burning pain, and that it sometimes makes it hurt to even have clothes touch it.   Labs are due today. She needs refills.   I have reviewed her active problem list, medication list, allergies, notes from last encounter, lab results for her appointment today.      No other concerns at this time.   Past Medical History:  Diagnosis Date   ADD (attention deficit disorder)    Bipolar disorder (HCC)    Clotting disorder (HCC)    History of appendectomy 02/19/2022   History of cholecystectomy 02/19/2022   History of DVT (deep vein thrombosis) 04/15/2015    Past Surgical History:  Procedure Laterality Date   APPENDECTOMY     CHOLECYSTECTOMY      Social History   Socioeconomic History   Marital status: Married    Spouse name: Not on file   Number of children: Not on file   Years of education: Not on file   Highest education level: Not on file  Occupational History   Occupation: works at [employer]  Tobacco Use   Smoking status: Never   Smokeless tobacco: Never  Vaping Use   Vaping status: Never Used  Substance and Sexual Activity   Alcohol use: No   Drug use: No   Sexual activity: Not on file    Comment: married  Other Topics Concern   Not on file  Social History Narrative   Not on file   Social Drivers of Health   Financial Resource Strain: Not on file  Food Insecurity: Not on file  Transportation Needs: Not on file  Physical Activity: Not on file  Stress: Not on file  Social Connections: Not on file   Intimate Partner Violence: Not on file    Family History  Problem Relation Age of Onset   Osteoporosis Mother    Clotting disorder Sister        DVT post op   Bipolar disorder Sister    Lupus Sister    Osteoporosis Maternal Grandmother    Dementia Maternal Grandmother    Cancer Maternal Grandfather        Throat Cancer   Cancer Paternal Grandmother        breast ca   Clotting disorder Paternal Grandfather        DVT    Allergies  Allergen Reactions   Latex Anaphylaxis   Phenobarbital Other (See Comments)    Caused seizure  Other Reaction(s): seizure    Review of Systems  Neurological:        Nerve pain right thigh  All other systems reviewed and are negative.      Objective:   BP 130/78   Pulse 78   Ht 5\' 3"  (1.6 m)   Wt 254 lb 3.2 oz (115.3 kg)   SpO2 96%   BMI 45.03 kg/m   Vitals:   03/24/23 0852  BP: 130/78  Pulse: 78  Height: 5\' 3"  (1.6 m)  Weight: 254 lb 3.2 oz (115.3 kg)  SpO2: 96%  BMI (Calculated): 45.04    Physical Exam Vitals and nursing note reviewed.  Constitutional:      Appearance: Normal appearance. She is normal weight.  HENT:     Head: Normocephalic.  Eyes:     Extraocular Movements: Extraocular movements intact.     Conjunctiva/sclera: Conjunctivae normal.     Pupils: Pupils are equal, round, and reactive to light.  Cardiovascular:     Rate and Rhythm: Normal rate and regular rhythm.     Pulses: Normal pulses.  Pulmonary:     Effort: Pulmonary effort is normal.  Musculoskeletal:        General: Normal range of motion.     Cervical back: Normal range of motion.  Neurological:     General: No focal deficit present.     Mental Status: She is alert and oriented to person, place, and time. Mental status is at baseline.  Psychiatric:        Mood and Affect: Mood normal.        Behavior: Behavior normal.        Thought Content: Thought content normal.        Judgment: Judgment normal.      No results found for any  visits on 03/24/23.  No results found for this or any previous visit (from the past 2160 hours).     Assessment & Plan:   Problem List Items Addressed This Visit       Digestive   Autoimmune hepatitis (HCC)   Patient stable.  Well controlled with current therapy.   Continue current meds.          Endocrine   Hypothyroidism - Primary   Patient stable.  Well controlled with current therapy.   Continue current meds.          Other   BMI 45.0-49.9, adult (HCC)   Continue current meds.  Will adjust as needed based on results.  The patient is asked to make an attempt to improve diet and exercise patterns to aid in medical management of this problem. Addressed importance of increasing and maintaining water intake.        Other Visit Diagnoses       Vitamin D deficiency, unspecified         B12 deficiency due to diet         Prediabetes           Return in about 3 months (around 06/24/2023) for F/U.   Total time spent: 20 minutes  Miki Kins, FNP  03/24/2023   This document may have been prepared by [facility] Voice Recognition software and as such may include unintentional dictation errors.

## 2023-03-26 LAB — CBC WITH DIFFERENTIAL/PLATELET
Basophils Absolute: 0.1 10*3/uL (ref 0.0–0.2)
Basos: 1 %
EOS (ABSOLUTE): 0.3 10*3/uL (ref 0.0–0.4)
Eos: 4 %
Hematocrit: 39.9 % (ref 34.0–46.6)
Hemoglobin: 13.3 g/dL (ref 11.1–15.9)
Immature Grans (Abs): 0 10*3/uL (ref 0.0–0.1)
Immature Granulocytes: 0 %
Lymphocytes Absolute: 2 10*3/uL (ref 0.7–3.1)
Lymphs: 25 %
MCH: 32.2 pg (ref 26.6–33.0)
MCHC: 33.3 g/dL (ref 31.5–35.7)
MCV: 97 fL (ref 79–97)
Monocytes Absolute: 0.6 10*3/uL (ref 0.1–0.9)
Monocytes: 7 %
Neutrophils Absolute: 5.1 10*3/uL (ref 1.4–7.0)
Neutrophils: 63 %
Platelets: 309 10*3/uL (ref 150–450)
RBC: 4.13 x10E6/uL (ref 3.77–5.28)
RDW: 12.6 % (ref 11.7–15.4)
WBC: 8 10*3/uL (ref 3.4–10.8)

## 2023-03-26 LAB — HEMOGLOBIN A1C
Est. average glucose Bld gHb Est-mCnc: 94 mg/dL
Hgb A1c MFr Bld: 4.9 % (ref 4.8–5.6)

## 2023-03-26 LAB — CMP14+EGFR
ALT: 22 IU/L (ref 0–32)
AST: 21 IU/L (ref 0–40)
Albumin: 4.3 g/dL (ref 3.8–4.9)
Alkaline Phosphatase: 105 IU/L (ref 44–121)
BUN/Creatinine Ratio: 14 (ref 9–23)
BUN: 15 mg/dL (ref 6–24)
Bilirubin Total: 0.5 mg/dL (ref 0.0–1.2)
CO2: 24 mmol/L (ref 20–29)
Calcium: 9.5 mg/dL (ref 8.7–10.2)
Chloride: 106 mmol/L (ref 96–106)
Creatinine, Ser: 1.11 mg/dL — ABNORMAL HIGH (ref 0.57–1.00)
Globulin, Total: 2.5 g/dL (ref 1.5–4.5)
Glucose: 91 mg/dL (ref 70–99)
Potassium: 4.5 mmol/L (ref 3.5–5.2)
Sodium: 142 mmol/L (ref 134–144)
Total Protein: 6.8 g/dL (ref 6.0–8.5)
eGFR: 59 mL/min/{1.73_m2} — ABNORMAL LOW (ref >59)

## 2023-03-26 LAB — LIPID PANEL
Chol/HDL Ratio: 2.9 ratio (ref 0.0–4.4)
Cholesterol, Total: 190 mg/dL (ref 100–199)
HDL: 66 mg/dL (ref >39)
LDL Chol Calc (NIH): 114 mg/dL — ABNORMAL HIGH (ref 0–99)
Triglycerides: 53 mg/dL (ref 0–149)
VLDL Cholesterol Cal: 10 mg/dL (ref 5–40)

## 2023-03-26 LAB — TSH: TSH: 1.49 u[IU]/mL (ref 0.450–4.500)

## 2023-03-26 LAB — VITAMIN D 25 HYDROXY (VIT D DEFICIENCY, FRACTURES): Vit D, 25-Hydroxy: 59.9 ng/mL (ref 30.0–100.0)

## 2023-03-26 LAB — HCV RT-PCR, QUANT (NON-GRAPH): Hepatitis C Quantitation: NOT DETECTED [IU]/mL

## 2023-03-26 LAB — HCV AB W REFLEX TO QUANT PCR: HCV Ab: REACTIVE — AB

## 2023-03-26 LAB — HIV ANTIBODY (ROUTINE TESTING W REFLEX): HIV Screen 4th Generation wRfx: NONREACTIVE

## 2023-03-26 LAB — VITAMIN B12: Vitamin B-12: 271 pg/mL (ref 232–1245)

## 2023-04-26 ENCOUNTER — Other Ambulatory Visit: Payer: Self-pay

## 2023-04-26 DIAGNOSIS — E538 Deficiency of other specified B group vitamins: Secondary | ICD-10-CM

## 2023-04-26 MED ORDER — CYANOCOBALAMIN 1000 MCG/ML IJ SOLN: 1000.0000 ug | INTRAMUSCULAR | 11 refills | Status: DC

## 2023-04-26 MED ORDER — INSULIN SYRINGE 29G X 1/2" 1 ML MISC
1.0000 | Freq: Every day | 11 refills | Status: AC
Start: 2023-04-26 — End: 2023-05-26

## 2023-05-12 ENCOUNTER — Encounter: Payer: Self-pay | Admitting: Family

## 2023-05-13 ENCOUNTER — Other Ambulatory Visit: Payer: Self-pay

## 2023-05-13 MED ORDER — SEMAGLUTIDE-WEIGHT MANAGEMENT 2.4 MG/0.75ML ~~LOC~~ SOAJ: 2.4000 mg | SUBCUTANEOUS | 3 refills | Status: DC

## 2023-06-24 ENCOUNTER — Ambulatory Visit: Payer: PRIVATE HEALTH INSURANCE | Admitting: Family

## 2023-06-28 ENCOUNTER — Other Ambulatory Visit: Payer: Self-pay | Admitting: Physician Assistant

## 2023-06-28 DIAGNOSIS — F4312 Post-traumatic stress disorder, chronic: Secondary | ICD-10-CM

## 2023-06-29 ENCOUNTER — Telehealth: Payer: Self-pay | Admitting: Physician Assistant

## 2023-06-29 ENCOUNTER — Ambulatory Visit (INDEPENDENT_AMBULATORY_CARE_PROVIDER_SITE_OTHER): Payer: PRIVATE HEALTH INSURANCE | Admitting: Family

## 2023-06-29 ENCOUNTER — Encounter: Payer: Self-pay | Admitting: Family

## 2023-06-29 DIAGNOSIS — E559 Vitamin D deficiency, unspecified: Secondary | ICD-10-CM

## 2023-06-29 DIAGNOSIS — E039 Hypothyroidism, unspecified: Secondary | ICD-10-CM | POA: Diagnosis not present

## 2023-06-29 DIAGNOSIS — D682 Hereditary deficiency of other clotting factors: Secondary | ICD-10-CM

## 2023-06-29 DIAGNOSIS — E538 Deficiency of other specified B group vitamins: Secondary | ICD-10-CM | POA: Diagnosis not present

## 2023-06-29 DIAGNOSIS — E782 Mixed hyperlipidemia: Secondary | ICD-10-CM | POA: Diagnosis not present

## 2023-06-29 DIAGNOSIS — R7303 Prediabetes: Secondary | ICD-10-CM

## 2023-06-29 DIAGNOSIS — Z114 Encounter for screening for human immunodeficiency virus [HIV]: Secondary | ICD-10-CM

## 2023-06-29 DIAGNOSIS — Z1159 Encounter for screening for other viral diseases: Secondary | ICD-10-CM

## 2023-06-29 MED ORDER — VITAMIN D (ERGOCALCIFEROL) 1.25 MG (50000 UNIT) PO CAPS: 50000.0000 [IU] | ORAL_CAPSULE | ORAL | 3 refills | Status: AC

## 2023-06-29 MED ORDER — CYANOCOBALAMIN 1000 MCG/ML IJ SOLN: 1000.0000 ug | INTRAMUSCULAR | 11 refills | Status: AC

## 2023-06-29 MED ORDER — SEMAGLUTIDE-WEIGHT MANAGEMENT 2.4 MG/0.75ML ~~LOC~~ SOAJ: 2.4000 mg | SUBCUTANEOUS | 3 refills | Status: DC

## 2023-06-29 NOTE — Telephone Encounter (Signed)
 [facility] and provided all the requested information.

## 2023-06-29 NOTE — Telephone Encounter (Signed)
 [facility]. She needs a diagnosis to fill the Alprazolom.

## 2023-06-29 NOTE — Progress Notes (Signed)
 Established Patient Office Visit  Subjective:  Patient ID: Dana Rodgers, female    DOB: [birth year]  Age: 53 y.o. MRN: [id number]  Chief Complaint  Patient presents with   Follow-up    3 month follow up    Patient is here today for her 3 months follow up.  She has been feeling fairly well since last appointment.   She does not have additional concerns to discuss today.  Labs are due today. She needs refills.   I have reviewed her active problem list, medication list, allergies, notes from last encounter, lab results for her appointment today.      No other concerns at this time.   Past Medical History:  Diagnosis Date   ADD (attention deficit disorder)    Bipolar disorder (HCC)    Clotting disorder (HCC)    History of appendectomy 02/19/2022   History of cholecystectomy 02/19/2022   History of DVT (deep vein thrombosis) 04/15/2015    Past Surgical History:  Procedure Laterality Date   APPENDECTOMY     CHOLECYSTECTOMY      Social History   Socioeconomic History   Marital status: Married    Spouse name: Not on file   Number of children: Not on file   Years of education: Not on file   Highest education level: Not on file  Occupational History   Occupation: works at [employer]  Tobacco Use   Smoking status: Never   Smokeless tobacco: Never  Vaping Use   Vaping status: Never Used  Substance and Sexual Activity   Alcohol use: No   Drug use: No   Sexual activity: Not on file    Comment: married  Other Topics Concern   Not on file  Social History Narrative   Not on file   Social Drivers of Health   Financial Resource Strain: Not on file  Food Insecurity: Not on file  Transportation Needs: Not on file  Physical Activity: Not on file  Stress: Not on file  Social Connections: Not on file  Intimate Partner Violence: Not on file    Family History  Problem Relation Age of Onset   Osteoporosis Mother    Clotting disorder Sister        DVT post  op   Bipolar disorder Sister    Lupus Sister    Osteoporosis Maternal Grandmother    Dementia Maternal Grandmother    Cancer Maternal Grandfather        Throat Cancer   Cancer Paternal Grandmother        breast ca   Clotting disorder Paternal Grandfather        DVT    Allergies  Allergen Reactions   Latex Anaphylaxis   Phenobarbital Other (See Comments)    Caused seizure  Other Reaction(s): seizure    Review of Systems  All other systems reviewed and are negative.      Objective:   BP 106/68   Pulse 66   Ht 5' 3 (1.6 m)   Wt 267 lb 9.6 oz (121.4 kg)   SpO2 95%   BMI 47.40 kg/m   Vitals:   06/29/23 0856  BP: 106/68  Pulse: 66  Height: 5' 3 (1.6 m)  Weight: 267 lb 9.6 oz (121.4 kg)  SpO2: 95%  BMI (Calculated): 47.42    Physical Exam Vitals and nursing note reviewed.  Constitutional:      Appearance: Normal appearance. She is normal weight.  HENT:     Head:  Normocephalic.  Eyes:     Extraocular Movements: Extraocular movements intact.     Conjunctiva/sclera: Conjunctivae normal.     Pupils: Pupils are equal, round, and reactive to light.  Cardiovascular:     Rate and Rhythm: Normal rate.  Pulmonary:     Effort: Pulmonary effort is normal.  Neurological:     General: No focal deficit present.     Mental Status: She is alert and oriented to person, place, and time. Mental status is at baseline.  Psychiatric:        Mood and Affect: Mood normal.        Behavior: Behavior normal.        Thought Content: Thought content normal.        Judgment: Judgment normal.      No results found for any visits on 06/29/23.  No results found for this or any previous visit (from the past 2160 hours).     Assessment & Plan:   Problem List Items Addressed This Visit       Endocrine   Hypothyroidism   Relevant Orders   CMP14+EGFR   CBC with Diff   TSH     Hematopoietic and Hemostatic   Factor V deficiency (HCC)   Relevant Orders   CMP14+EGFR    CBC with Diff   Protime-INR   Other Visit Diagnoses       Vitamin D  deficiency, unspecified       Checking labs today.  Will continue supplements as needed.   Relevant Orders   CMP14+EGFR   VITAMIN D  25 Hydroxy (Vit-D Deficiency, Fractures)   CBC with Diff     B12 deficiency due to diet       Checking labs today.  Will continue supplements as needed.   Relevant Orders   CMP14+EGFR   Vitamin B12   CBC with Diff     Prediabetes       A1C is in prediabetic ranges. Patient counseled on dietary choices and verbalized understanding. Will reassess at follow up after next lab check.   Relevant Orders   CMP14+EGFR   CBC with Diff   Hemoglobin A1c     Mixed hyperlipidemia       Checking labs today.  Continue current therapy for lipid control. Will modify as needed based on labwork results.   Relevant Orders   CMP14+EGFR   Lipid panel   CBC with Diff     Need for hepatitis C screening test       Test ordered in office today. Will call with results.   Relevant Orders   CMP14+EGFR   CBC with Diff     Screening for HIV without presence of risk factors       Test ordered in office today. Will call with results.   Relevant Orders   CMP14+EGFR   CBC with Diff       Return in about 4 months (around 10/29/2023).   Total time spent: 20 minutes  ALAN CHRISTELLA ARRANT, FNP  06/29/2023   This document may have been prepared by [facility] Voice Recognition software and as such may include unintentional dictation errors.

## 2023-06-30 LAB — CMP14+EGFR
ALT: 17 IU/L (ref 0–32)
AST: 18 IU/L (ref 0–40)
Albumin: 4 g/dL (ref 3.8–4.9)
Alkaline Phosphatase: 94 IU/L (ref 44–121)
BUN/Creatinine Ratio: 11 (ref 9–23)
BUN: 11 mg/dL (ref 6–24)
Bilirubin Total: 0.5 mg/dL (ref 0.0–1.2)
CO2: 21 mmol/L (ref 20–29)
Calcium: 9.5 mg/dL (ref 8.7–10.2)
Chloride: 105 mmol/L (ref 96–106)
Creatinine, Ser: 0.97 mg/dL (ref 0.57–1.00)
Globulin, Total: 2.5 g/dL (ref 1.5–4.5)
Glucose: 87 mg/dL (ref 70–99)
Potassium: 4.5 mmol/L (ref 3.5–5.2)
Sodium: 141 mmol/L (ref 134–144)
Total Protein: 6.5 g/dL (ref 6.0–8.5)
eGFR: 69 mL/min/{1.73_m2} (ref >59)

## 2023-06-30 LAB — CBC WITH DIFFERENTIAL/PLATELET
Basophils Absolute: 0.1 10*3/uL (ref 0.0–0.2)
Basos: 1 %
EOS (ABSOLUTE): 0.4 10*3/uL (ref 0.0–0.4)
Eos: 5 %
Hematocrit: 42 % (ref 34.0–46.6)
Hemoglobin: 13.2 g/dL (ref 11.1–15.9)
Immature Grans (Abs): 0 10*3/uL (ref 0.0–0.1)
Immature Granulocytes: 0 %
Lymphocytes Absolute: 2.2 10*3/uL (ref 0.7–3.1)
Lymphs: 28 %
MCH: 31.3 pg (ref 26.6–33.0)
MCHC: 31.4 g/dL — ABNORMAL LOW (ref 31.5–35.7)
MCV: 100 fL — ABNORMAL HIGH (ref 79–97)
Monocytes Absolute: 0.5 10*3/uL (ref 0.1–0.9)
Monocytes: 7 %
Neutrophils Absolute: 4.7 10*3/uL (ref 1.4–7.0)
Neutrophils: 59 %
Platelets: 311 10*3/uL (ref 150–450)
RBC: 4.22 x10E6/uL (ref 3.77–5.28)
RDW: 12.6 % (ref 11.7–15.4)
WBC: 7.9 10*3/uL (ref 3.4–10.8)

## 2023-06-30 LAB — TSH: TSH: 1.95 u[IU]/mL (ref 0.450–4.500)

## 2023-06-30 LAB — LIPID PANEL
Chol/HDL Ratio: 3.2 ratio (ref 0.0–4.4)
Cholesterol, Total: 214 mg/dL — ABNORMAL HIGH (ref 100–199)
HDL: 66 mg/dL (ref >39)
LDL Chol Calc (NIH): 138 mg/dL — ABNORMAL HIGH (ref 0–99)
Triglycerides: 59 mg/dL (ref 0–149)
VLDL Cholesterol Cal: 10 mg/dL (ref 5–40)

## 2023-06-30 LAB — HEMOGLOBIN A1C
Est. average glucose Bld gHb Est-mCnc: 94 mg/dL
Hgb A1c MFr Bld: 4.9 % (ref 4.8–5.6)

## 2023-06-30 LAB — VITAMIN D 25 HYDROXY (VIT D DEFICIENCY, FRACTURES): Vit D, 25-Hydroxy: 43.1 ng/mL (ref 30.0–100.0)

## 2023-06-30 LAB — VITAMIN B12: Vitamin B-12: 1633 pg/mL — ABNORMAL HIGH (ref 232–1245)

## 2023-07-04 ENCOUNTER — Other Ambulatory Visit: Payer: Self-pay

## 2023-07-04 ENCOUNTER — Ambulatory Visit: Payer: Self-pay

## 2023-07-04 DIAGNOSIS — E782 Mixed hyperlipidemia: Secondary | ICD-10-CM

## 2023-07-04 MED ORDER — ROSUVASTATIN CALCIUM 5 MG PO TABS
5.0000 mg | ORAL_TABLET | Freq: Every day | ORAL | 11 refills | Status: AC
Start: 2023-07-04 — End: 2024-07-03

## 2023-07-05 ENCOUNTER — Other Ambulatory Visit: Payer: Self-pay

## 2023-07-05 DIAGNOSIS — G4700 Insomnia, unspecified: Secondary | ICD-10-CM

## 2023-07-06 MED ORDER — ZOLPIDEM TARTRATE 10 MG PO TABS: 10.0000 mg | ORAL_TABLET | Freq: Every day | ORAL | 1 refills | Status: DC

## 2023-07-13 ENCOUNTER — Telehealth: Payer: Self-pay | Admitting: Physician Assistant

## 2023-07-13 ENCOUNTER — Other Ambulatory Visit: Payer: Self-pay

## 2023-07-13 DIAGNOSIS — F902 Attention-deficit hyperactivity disorder, combined type: Secondary | ICD-10-CM

## 2023-07-13 MED ORDER — AMPHETAMINE-DEXTROAMPHETAMINE 20 MG PO TABS: ORAL_TABLET | ORAL | 0 refills | Status: DC

## 2023-07-13 NOTE — Telephone Encounter (Signed)
 Next appt is 09/01/23. Requesting a refill on her Adderall 20 mg called to:   [id number] [place], [place] - [address] AT [facility] Phone: [phone]  Fax: [phone]

## 2023-07-13 NOTE — Telephone Encounter (Signed)
 [facility] [place]

## 2023-07-28 ENCOUNTER — Other Ambulatory Visit: Payer: Self-pay | Admitting: Family

## 2023-08-16 ENCOUNTER — Other Ambulatory Visit: Payer: Self-pay | Admitting: Physician Assistant

## 2023-08-16 DIAGNOSIS — F4312 Post-traumatic stress disorder, chronic: Secondary | ICD-10-CM

## 2023-08-16 DIAGNOSIS — F3175 Bipolar disorder, in partial remission, most recent episode depressed: Secondary | ICD-10-CM

## 2023-08-25 ENCOUNTER — Ambulatory Visit (INDEPENDENT_AMBULATORY_CARE_PROVIDER_SITE_OTHER): Payer: PRIVATE HEALTH INSURANCE | Admitting: Physician Assistant

## 2023-08-25 ENCOUNTER — Encounter: Payer: Self-pay | Admitting: Physician Assistant

## 2023-08-25 DIAGNOSIS — F902 Attention-deficit hyperactivity disorder, combined type: Secondary | ICD-10-CM | POA: Diagnosis not present

## 2023-08-25 DIAGNOSIS — G4700 Insomnia, unspecified: Secondary | ICD-10-CM | POA: Diagnosis not present

## 2023-08-25 DIAGNOSIS — F4312 Post-traumatic stress disorder, chronic: Secondary | ICD-10-CM

## 2023-08-25 DIAGNOSIS — F3175 Bipolar disorder, in partial remission, most recent episode depressed: Secondary | ICD-10-CM

## 2023-08-25 MED ORDER — BUPROPION HCL ER (XL) 150 MG PO TB24: 150.0000 mg | ORAL_TABLET | Freq: Every day | ORAL | 1 refills | Status: DC

## 2023-08-25 MED ORDER — AMPHETAMINE-DEXTROAMPHETAMINE 20 MG PO TABS: ORAL_TABLET | ORAL | 0 refills | Status: AC

## 2023-08-25 MED ORDER — ALPRAZOLAM 1 MG PO TABS: ORAL_TABLET | ORAL | 5 refills | Status: AC

## 2023-08-25 MED ORDER — ZOLPIDEM TARTRATE 10 MG PO TABS: 10.0000 mg | ORAL_TABLET | Freq: Every day | ORAL | 5 refills | Status: AC

## 2023-08-25 NOTE — Progress Notes (Signed)
 [facility]  Patient ID: Dana Rodgers,  MRN: [id number]  PCP: Orlean Alan HERO, FNP  Date of Evaluation: 08/25/2023 Time spent:25 minutes  Chief Complaint:  Chief Complaint   Anxiety; Depression; Insomnia; Follow-up    HISTORY/CURRENT STATUS: HPI  For 6 month med check.   Has been more anxious lately.  Not sure why.  Feels panicky almost daily. Taking one Xanax  daily, sometimes twice, which is more than usual. It is effective. Her husband was home for a month, which might have been  a trigger.  He travels for work and can be gone for months sometimes, so it's different when he's home, just little things, like loading the dishwasher or something.  He's gone again now though.   Patient is able to enjoy things.  Energy and motivation are good.  Work is going well.   No extreme sadness, tearfulness, or feelings of hopelessness.  Sleeps ok. ADLs and personal hygiene are normal.   Appetite has not changed.  Weight is stable.   No mania, delirium, AH/VH.  No SI/HI.  Adderall is still effective.  States that attention is good without easy distractibility.  Able to focus on things and finish tasks to completion.   Denies dizziness, syncope, seizures, numbness, tingling, tremor, tics, unsteady gait, slurred speech, confusion. Denies muscle or joint pain, stiffness, or dystonia. Denies unexplained weight loss, frequent infections, or sores that heal slowly.  No polyphagia, polydipsia, or polyuria. Denies visual changes or paresthesias.   Individual Medical History/ Review of Systems: Changes? :No   Past Psychiatric Medication Trials: Saphris  Ambien  Lamictal  Wellbutrin  XL Paxil Adderall Xanax  Ativan wasn't effective  Allergies: Latex and Phenobarbital  Current Medications:  Current Outpatient Medications:    asenapine  (SAPHRIS ) 5 MG SUBL 24 hr tablet, Place 1 tablet (5 mg total) under the tongue 2 (two) times daily., Disp: 180 tablet, Rfl: 1   azelastine  (ASTELIN ) 0.1 %  nasal spray, Place 1 spray into both nostrils 2 (two) times daily. Use in each nostril as directed, Disp: 30 mL, Rfl: 12   cyanocobalamin  (VITAMIN B12) 1000 MCG/ML injection, Inject 1 mL (1,000 mcg total) into the muscle once a week., Disp: 4 mL, Rfl: 11   fexofenadine  (ALLEGRA ) 180 MG tablet, TAKE 1 TABLET BY MOUTH EVERY DAY, Disp: 100 tablet, Rfl: 1   lamoTRIgine  (LAMICTAL ) 200 MG tablet, TAKE 1 TABLET BY MOUTH EVERYDAY AT BEDTIME, Disp: 90 tablet, Rfl: 1   rosuvastatin  (CRESTOR ) 5 MG tablet, Take 1 tablet (5 mg total) by mouth daily., Disp: 30 tablet, Rfl: 11   Semaglutide -Weight Management 2.4 MG/0.75ML SOAJ, Inject 2.4 mg into the skin once a week., Disp: 3 mL, Rfl: 3   Vitamin D , Ergocalciferol , (DRISDOL ) 1.25 MG (50000 UNIT) CAPS capsule, Take 1 capsule (50,000 Units total) by mouth every 7 (seven) days., Disp: 12 capsule, Rfl: 3   ALPRAZolam  (XANAX ) 1 MG tablet, TAKE 1 TABLET (1 MG TOTAL) BY MOUTH 2 (TWO) TIMES DAILY AS NEEDED. *, Disp: 60 tablet, Rfl: 5   amphetamine -dextroamphetamine  (ADDERALL) 20 MG tablet, Take 1.5 tablets in the morning and 1 tablet at noon and 1600, Disp: 105 tablet, Rfl: 0   [START ON 09/24/2023] amphetamine -dextroamphetamine  (ADDERALL) 20 MG tablet, Take 1.5 tablets in the morning and 1 tablet at noon and at 1600, Disp: 105 tablet, Rfl: 0   [START ON 10/23/2023] amphetamine -dextroamphetamine  (ADDERALL) 20 MG tablet, Take 1.5 tablets total 30 mg every morning and 1 tablet every noon and 1600, Disp: 105 tablet, Rfl: 0  buPROPion  (WELLBUTRIN  XL) 150 MG 24 hr tablet, Take 1 tablet (150 mg total) by mouth daily after breakfast., Disp: 90 tablet, Rfl: 1   zolpidem  (AMBIEN ) 10 MG tablet, Take 1 tablet (10 mg total) by mouth at bedtime., Disp: 30 tablet, Rfl: 5 Medication Side Effects: none  Family Medical/ Social History: Changes? No  MENTAL HEALTH EXAM:  There were no vitals taken for this visit.There is no height or weight on file to calculate BMI.  General Appearance:  Casual, Well Groomed, and Obese  Eye Contact:  Good  Speech:  Clear and Coherent and Normal Rate  Volume:  Normal  Mood:  Euthymic  Affect:  Congruent  Thought Process:  Goal Directed and Descriptions of Associations: Circumstantial  Orientation:  Full (Time, Place, and Person)  Thought Content: Logical   Suicidal Thoughts:  No  Homicidal Thoughts:  No  Memory:  WNL  Judgement:  Good  Insight:  Good  Psychomotor Activity:  Normal  Concentration:  Concentration: Good and Attention Span: Good  Recall:  Good  Fund of Knowledge: Good  Language: Good  Assets:  Communication Skills Desire for Improvement Financial Resources/Insurance Housing Resilience Transportation Vocational/Educational  ADL's:  Intact  Cognition: WNL  Prognosis:  Good   Labs  06/29/2023 CBC nl CMP nl TC 214, Trig 59, HDL  66, LDL 138 A1C 4.9 TSH 1.9 B 12 1,633 Vit D 43.1  DIAGNOSES:    ICD-10-CM   1. Bipolar I disorder, current or most recent episode depressed, in partial remission with mixed features (HCC)  F31.75 buPROPion  (WELLBUTRIN  XL) 150 MG 24 hr tablet    2. Chronic post-traumatic stress disorder  F43.12 ALPRAZolam  (XANAX ) 1 MG tablet    3. Attention deficit hyperactivity disorder, combined type  F90.2 amphetamine -dextroamphetamine  (ADDERALL) 20 MG tablet    amphetamine -dextroamphetamine  (ADDERALL) 20 MG tablet    amphetamine -dextroamphetamine  (ADDERALL) 20 MG tablet    buPROPion  (WELLBUTRIN  XL) 150 MG 24 hr tablet    4. Insomnia, unspecified type  G47.00 zolpidem  (AMBIEN ) 10 MG tablet     Receiving Psychotherapy: No   RECOMMENDATIONS:  PDMP reviewed.  Ambien  filled 08/04/2023.  Xanax  filled 08/03/2023. I provided approximately 25 minutes of face to face time during this encounter, including time spent before and after the visit in records review, medical decision making, counseling pertinent to today's visit, and charting.   We discussed the anxiety.  Options are watchful waiting,  increase Lamictal , add Paxil back in since it helped before.  We agreed to make no changes right now, the increased anxiety could be circumstantial, which will improve in time.  If not, she'll call.   She is on a BZ, stimulant but has been on this combo for years, so I will not wean off either med.  May need to consider in the future though.  Sleep hygiene, she doesn't drink caffeine.   Continue Xanax  1 mg, 1 p.o. twice daily as needed.  Take as sparingly as possible. Continue Adderall 20 mg, 1.5 pills every morning, 1 at noon, and 1 at 4:00. Continue Saphris  5 mg twice daily. Continue Wellbutrin  XL 150 mg every morning. Continue Lamictal  200 mg nightly. Continue Ambien  10 mg, 1 p.o. nightly as needed sleep. Return in 6 months.  Verneita Cooks, PA-C

## 2023-09-01 ENCOUNTER — Ambulatory Visit: Payer: 59 | Admitting: Physician Assistant

## 2023-10-31 ENCOUNTER — Encounter: Payer: Self-pay | Admitting: Family

## 2023-10-31 ENCOUNTER — Ambulatory Visit (INDEPENDENT_AMBULATORY_CARE_PROVIDER_SITE_OTHER): Payer: PRIVATE HEALTH INSURANCE | Admitting: Family

## 2023-10-31 DIAGNOSIS — Z0130 Encounter for examination of blood pressure without abnormal findings: Secondary | ICD-10-CM

## 2023-10-31 DIAGNOSIS — E559 Vitamin D deficiency, unspecified: Secondary | ICD-10-CM

## 2023-10-31 DIAGNOSIS — R7303 Prediabetes: Secondary | ICD-10-CM

## 2023-10-31 DIAGNOSIS — D682 Hereditary deficiency of other clotting factors: Secondary | ICD-10-CM

## 2023-10-31 DIAGNOSIS — E039 Hypothyroidism, unspecified: Secondary | ICD-10-CM | POA: Diagnosis not present

## 2023-10-31 DIAGNOSIS — E538 Deficiency of other specified B group vitamins: Secondary | ICD-10-CM | POA: Diagnosis not present

## 2023-10-31 DIAGNOSIS — E782 Mixed hyperlipidemia: Secondary | ICD-10-CM

## 2023-10-31 MED ORDER — BACLOFEN 10 MG PO TABS
10.0000 mg | ORAL_TABLET | Freq: Every evening | ORAL | 1 refills | Status: DC | PRN
Start: 2023-10-31 — End: 2023-11-22

## 2023-10-31 NOTE — Progress Notes (Signed)
 Established Patient Office Visit  Subjective:  Patient ID: Dana Rodgers, female    DOB: 01/09/1971  Age: 53 y.o. MRN: [id number]  Chief Complaint  Patient presents with   Follow-up    4 month follow up    Patient is here today for her 4 months follow up.  She has been feeling fairly well since last appointment.   She does have additional concerns to discuss today.  She recently started a new job, has been on her feet much more than previously, and she has  Labs are due today.  She needs refills.   I have reviewed her active problem list, medication list, allergies, notes from last encounter, lab results for her appointment today.      No other concerns at this time.   Past Medical History:  Diagnosis Date   ADD (attention deficit disorder)    Bipolar disorder (HCC)    Clotting disorder    History of appendectomy 02/19/2022   History of cholecystectomy 02/19/2022   History of DVT (deep vein thrombosis) 04/15/2015    Past Surgical History:  Procedure Laterality Date   APPENDECTOMY     CHOLECYSTECTOMY      Social History   Socioeconomic History   Marital status: Married    Spouse name: Not on file   Number of children: Not on file   Years of education: Not on file   Highest education level: Not on file  Occupational History   Occupation: works at [employer]  Tobacco Use   Smoking status: Never   Smokeless tobacco: Never  Vaping Use   Vaping status: Never Used  Substance and Sexual Activity   Alcohol use: No   Drug use: No   Sexual activity: Not on file    Comment: married  Other Topics Concern   Not on file  Social History Narrative   Not on file   Social Drivers of Health   Financial Resource Strain: Not on file  Food Insecurity: Not on file  Transportation Needs: Not on file  Physical Activity: Not on file  Stress: Not on file  Social Connections: Not on file  Intimate Partner Violence: Not on file    Family History  Problem  Relation Age of Onset   Osteoporosis Mother    Clotting disorder Sister        DVT post op   Bipolar disorder Sister    Lupus Sister    Osteoporosis Maternal Grandmother    Dementia Maternal Grandmother    Cancer Maternal Grandfather        Throat Cancer   Cancer Paternal Grandmother        breast ca   Clotting disorder Paternal Grandfather        DVT    Allergies  Allergen Reactions   Latex Anaphylaxis   Phenobarbital Other (See Comments)    Caused seizure  Other Reaction(s): seizure    Review of Systems  All other systems reviewed and are negative.      Objective:   BP 128/78   Pulse 74   Ht 5' 3 (1.6 m)   Wt 268 lb 3.2 oz (121.7 kg)   SpO2 98%   BMI 47.51 kg/m   Vitals:   10/31/23 0859  BP: 128/78  Pulse: 74  Height: 5' 3 (1.6 m)  Weight: 268 lb 3.2 oz (121.7 kg)  SpO2: 98%  BMI (Calculated): 47.52    Physical Exam Vitals and nursing note reviewed.  Constitutional:  Appearance: Normal appearance. She is normal weight.  HENT:     Head: Normocephalic.  Eyes:     Extraocular Movements: Extraocular movements intact.     Conjunctiva/sclera: Conjunctivae normal.     Pupils: Pupils are equal, round, and reactive to light.  Cardiovascular:     Rate and Rhythm: Normal rate.  Pulmonary:     Effort: Pulmonary effort is normal.  Neurological:     General: No focal deficit present.     Mental Status: She is alert and oriented to person, place, and time. Mental status is at baseline.  Psychiatric:        Mood and Affect: Mood normal.        Behavior: Behavior normal.        Thought Content: Thought content normal.      No results found for any visits on 10/31/23.  No results found for this or any previous visit (from the past 2160 hours).     Assessment & Plan Vitamin D  deficiency, unspecified B12 deficiency due to diet Hypothyroidism, unspecified type Checking labs today.  Will continue supplements as needed.   - Vitamin D  - Vitamin  B12 - TSH  Prediabetes A1C Continues to be in prediabetic ranges.  Will reassess at follow up after next lab check.  Patient counseled on dietary choices and verbalized understanding.   -CBC w/Diff -CMP w/eGFR -Hemoglobin A1C  Mixed hyperlipidemia Checking labs today.  Continue current therapy for lipid control. Will modify as needed based on labwork results.   -CMP w/eGFR -Lipid Panel  Factor V deficiency (HCC) Checking PT INR today.  Will call with results when available.      Return in about 4 months (around 03/02/2024).   Total time spent: 20 minutes  ALAN CHRISTELLA ARRANT, FNP  10/31/2023   This document may have been prepared by [facility] Voice Recognition software and as such may include unintentional dictation errors.

## 2023-10-31 NOTE — Assessment & Plan Note (Signed)
 Checking labs today.  Will continue supplements as needed.   - Vitamin D  - Vitamin B12 - TSH

## 2023-11-01 LAB — CBC WITH DIFFERENTIAL/PLATELET
Basophils Absolute: 0.1 x10E3/uL (ref 0.0–0.2)
Basos: 1 %
EOS (ABSOLUTE): 0.3 x10E3/uL (ref 0.0–0.4)
Eos: 4 %
Hematocrit: 42.2 % (ref 34.0–46.6)
Hemoglobin: 13.5 g/dL (ref 11.1–15.9)
Immature Grans (Abs): 0 x10E3/uL (ref 0.0–0.1)
Immature Granulocytes: 0 %
Lymphocytes Absolute: 2.6 x10E3/uL (ref 0.7–3.1)
Lymphs: 33 %
MCH: 31.8 pg (ref 26.6–33.0)
MCHC: 32 g/dL (ref 31.5–35.7)
MCV: 100 fL — ABNORMAL HIGH (ref 79–97)
Monocytes Absolute: 0.6 x10E3/uL (ref 0.1–0.9)
Monocytes: 8 %
Neutrophils Absolute: 4.1 x10E3/uL (ref 1.4–7.0)
Neutrophils: 54 %
Platelets: 310 x10E3/uL (ref 150–450)
RBC: 4.24 x10E6/uL (ref 3.77–5.28)
RDW: 12.8 % (ref 11.7–15.4)
WBC: 7.7 x10E3/uL (ref 3.4–10.8)

## 2023-11-01 LAB — CMP14+EGFR
ALT: 15 IU/L (ref 0–32)
AST: 22 IU/L (ref 0–40)
Albumin: 4.5 g/dL (ref 3.8–4.9)
Alkaline Phosphatase: 107 IU/L (ref 49–135)
BUN/Creatinine Ratio: 9 (ref 9–23)
BUN: 10 mg/dL (ref 6–24)
Bilirubin Total: 0.7 mg/dL (ref 0.0–1.2)
CO2: 26 mmol/L (ref 20–29)
Calcium: 9.6 mg/dL (ref 8.7–10.2)
Chloride: 104 mmol/L (ref 96–106)
Creatinine, Ser: 1.1 mg/dL — ABNORMAL HIGH (ref 0.57–1.00)
Globulin, Total: 2.6 g/dL (ref 1.5–4.5)
Glucose: 92 mg/dL (ref 70–99)
Potassium: 4.4 mmol/L (ref 3.5–5.2)
Sodium: 143 mmol/L (ref 134–144)
Total Protein: 7.1 g/dL (ref 6.0–8.5)
eGFR: 60 mL/min/1.73 (ref >59)

## 2023-11-01 LAB — LIPID PANEL
Chol/HDL Ratio: 2.4 ratio (ref 0.0–4.4)
Cholesterol, Total: 144 mg/dL (ref 100–199)
HDL: 59 mg/dL (ref >39)
LDL Chol Calc (NIH): 73 mg/dL (ref 0–99)
Triglycerides: 56 mg/dL (ref 0–149)
VLDL Cholesterol Cal: 12 mg/dL (ref 5–40)

## 2023-11-01 LAB — VITAMIN D 25 HYDROXY (VIT D DEFICIENCY, FRACTURES): Vit D, 25-Hydroxy: 68.9 ng/mL (ref 30.0–100.0)

## 2023-11-01 LAB — HEMOGLOBIN A1C
Est. average glucose Bld gHb Est-mCnc: 94 mg/dL
Hgb A1c MFr Bld: 4.9 % (ref 4.8–5.6)

## 2023-11-01 LAB — PROTIME-INR
INR: 0.9 (ref 0.9–1.2)
Prothrombin Time: 9.8 s (ref 9.1–12.0)

## 2023-11-01 LAB — VITAMIN B12: Vitamin B-12: 1835 pg/mL — ABNORMAL HIGH (ref 232–1245)

## 2023-11-01 LAB — TSH: TSH: 1.11 u[IU]/mL (ref 0.450–4.500)

## 2023-11-22 ENCOUNTER — Other Ambulatory Visit: Payer: Self-pay | Admitting: Family

## 2023-12-03 ENCOUNTER — Other Ambulatory Visit: Payer: Self-pay | Admitting: Physician Assistant

## 2023-12-03 DIAGNOSIS — F4312 Post-traumatic stress disorder, chronic: Secondary | ICD-10-CM

## 2023-12-03 DIAGNOSIS — F3175 Bipolar disorder, in partial remission, most recent episode depressed: Secondary | ICD-10-CM

## 2024-01-07 ENCOUNTER — Other Ambulatory Visit: Payer: Self-pay | Admitting: Family

## 2024-02-18 ENCOUNTER — Other Ambulatory Visit: Payer: Self-pay | Admitting: Family

## 2024-02-19 ENCOUNTER — Other Ambulatory Visit: Payer: Self-pay | Admitting: Physician Assistant

## 2024-02-19 DIAGNOSIS — F902 Attention-deficit hyperactivity disorder, combined type: Secondary | ICD-10-CM

## 2024-02-19 DIAGNOSIS — F3175 Bipolar disorder, in partial remission, most recent episode depressed: Secondary | ICD-10-CM

## 2024-02-27 ENCOUNTER — Ambulatory Visit: Admitting: Physician Assistant

## 2024-03-02 ENCOUNTER — Ambulatory Visit: Payer: PRIVATE HEALTH INSURANCE | Admitting: Family
